# Patient Record
Sex: Male | Born: 1950
Health system: Southern US, Community
[De-identification: ages and names within clinical notes are randomized; demographics above are authoritative.]

## PROBLEM LIST (undated history)

## (undated) DIAGNOSIS — I2510 Atherosclerotic heart disease of native coronary artery without angina pectoris: Secondary | ICD-10-CM

## (undated) DIAGNOSIS — I509 Heart failure, unspecified: Secondary | ICD-10-CM

## (undated) DIAGNOSIS — C801 Malignant (primary) neoplasm, unspecified: Secondary | ICD-10-CM

## (undated) DIAGNOSIS — Z87442 Personal history of urinary calculi: Secondary | ICD-10-CM

## (undated) DIAGNOSIS — I10 Essential (primary) hypertension: Secondary | ICD-10-CM

## (undated) DIAGNOSIS — E119 Type 2 diabetes mellitus without complications: Secondary | ICD-10-CM

## (undated) DIAGNOSIS — M5430 Sciatica, unspecified side: Secondary | ICD-10-CM

## (undated) DIAGNOSIS — E785 Hyperlipidemia, unspecified: Secondary | ICD-10-CM

## (undated) DIAGNOSIS — N200 Calculus of kidney: Secondary | ICD-10-CM

## (undated) HISTORY — DX: Calculus of kidney: N20.0

## (undated) HISTORY — DX: Malignant (primary) neoplasm, unspecified: C80.1

## (undated) HISTORY — DX: Hyperlipidemia, unspecified: E78.5

## (undated) HISTORY — DX: Heart failure, unspecified: I50.9

## (undated) HISTORY — DX: Essential (primary) hypertension: I10

## (undated) HISTORY — PX: BACK SURGERY: SHX140

## (undated) HISTORY — PX: COLONOSCOPY: SHX174

## (undated) HISTORY — PX: LITHOTRIPSY: SUR834

## (undated) HISTORY — DX: Type 2 diabetes mellitus without complications: E11.9

## (undated) HISTORY — DX: Sciatica, unspecified side: M54.30

---

## 2016-03-15 DIAGNOSIS — R739 Hyperglycemia, unspecified: Secondary | ICD-10-CM | POA: Diagnosis not present

## 2016-03-15 DIAGNOSIS — Z720 Tobacco use: Secondary | ICD-10-CM | POA: Diagnosis not present

## 2016-03-15 DIAGNOSIS — N132 Hydronephrosis with renal and ureteral calculous obstruction: Secondary | ICD-10-CM | POA: Diagnosis not present

## 2016-03-15 DIAGNOSIS — E860 Dehydration: Secondary | ICD-10-CM | POA: Diagnosis not present

## 2016-03-15 DIAGNOSIS — N390 Urinary tract infection, site not specified: Secondary | ICD-10-CM | POA: Diagnosis not present

## 2016-03-15 DIAGNOSIS — R1032 Left lower quadrant pain: Secondary | ICD-10-CM | POA: Diagnosis not present

## 2016-03-16 DIAGNOSIS — N132 Hydronephrosis with renal and ureteral calculous obstruction: Secondary | ICD-10-CM | POA: Diagnosis not present

## 2016-03-16 DIAGNOSIS — N202 Calculus of kidney with calculus of ureter: Secondary | ICD-10-CM | POA: Diagnosis not present

## 2016-03-16 DIAGNOSIS — R739 Hyperglycemia, unspecified: Secondary | ICD-10-CM | POA: Diagnosis not present

## 2016-03-16 DIAGNOSIS — R1032 Left lower quadrant pain: Secondary | ICD-10-CM | POA: Diagnosis not present

## 2016-03-16 DIAGNOSIS — N201 Calculus of ureter: Secondary | ICD-10-CM | POA: Diagnosis not present

## 2016-03-16 DIAGNOSIS — Z452 Encounter for adjustment and management of vascular access device: Secondary | ICD-10-CM | POA: Diagnosis not present

## 2016-03-16 DIAGNOSIS — N390 Urinary tract infection, site not specified: Secondary | ICD-10-CM | POA: Diagnosis not present

## 2016-03-16 DIAGNOSIS — N23 Unspecified renal colic: Secondary | ICD-10-CM | POA: Diagnosis not present

## 2016-03-16 DIAGNOSIS — E860 Dehydration: Secondary | ICD-10-CM | POA: Diagnosis not present

## 2016-03-16 DIAGNOSIS — Z720 Tobacco use: Secondary | ICD-10-CM | POA: Diagnosis not present

## 2016-03-17 DIAGNOSIS — N23 Unspecified renal colic: Secondary | ICD-10-CM | POA: Diagnosis not present

## 2016-03-17 DIAGNOSIS — N202 Calculus of kidney with calculus of ureter: Secondary | ICD-10-CM | POA: Diagnosis not present

## 2016-03-17 DIAGNOSIS — N390 Urinary tract infection, site not specified: Secondary | ICD-10-CM | POA: Diagnosis not present

## 2016-03-21 DIAGNOSIS — N200 Calculus of kidney: Secondary | ICD-10-CM | POA: Diagnosis not present

## 2016-03-21 DIAGNOSIS — N201 Calculus of ureter: Secondary | ICD-10-CM | POA: Diagnosis not present

## 2016-03-21 DIAGNOSIS — N202 Calculus of kidney with calculus of ureter: Secondary | ICD-10-CM | POA: Diagnosis not present

## 2016-03-21 DIAGNOSIS — Z960 Presence of urogenital implants: Secondary | ICD-10-CM | POA: Diagnosis not present

## 2016-03-28 DIAGNOSIS — N201 Calculus of ureter: Secondary | ICD-10-CM | POA: Diagnosis not present

## 2016-03-28 DIAGNOSIS — Z836 Family history of other diseases of the respiratory system: Secondary | ICD-10-CM | POA: Diagnosis not present

## 2016-03-28 DIAGNOSIS — Z79899 Other long term (current) drug therapy: Secondary | ICD-10-CM | POA: Diagnosis not present

## 2016-03-28 DIAGNOSIS — R0602 Shortness of breath: Secondary | ICD-10-CM | POA: Diagnosis not present

## 2016-03-28 DIAGNOSIS — Z833 Family history of diabetes mellitus: Secondary | ICD-10-CM | POA: Diagnosis not present

## 2016-03-28 DIAGNOSIS — I729 Aneurysm of unspecified site: Secondary | ICD-10-CM | POA: Diagnosis not present

## 2016-03-28 DIAGNOSIS — Z87442 Personal history of urinary calculi: Secondary | ICD-10-CM | POA: Diagnosis not present

## 2016-03-28 DIAGNOSIS — Z01818 Encounter for other preprocedural examination: Secondary | ICD-10-CM | POA: Diagnosis not present

## 2016-03-28 DIAGNOSIS — N200 Calculus of kidney: Secondary | ICD-10-CM | POA: Diagnosis not present

## 2016-03-28 DIAGNOSIS — F17200 Nicotine dependence, unspecified, uncomplicated: Secondary | ICD-10-CM | POA: Diagnosis not present

## 2016-03-28 DIAGNOSIS — R079 Chest pain, unspecified: Secondary | ICD-10-CM | POA: Diagnosis not present

## 2016-03-28 DIAGNOSIS — Z538 Procedure and treatment not carried out for other reasons: Secondary | ICD-10-CM | POA: Diagnosis not present

## 2016-03-29 DIAGNOSIS — J449 Chronic obstructive pulmonary disease, unspecified: Secondary | ICD-10-CM | POA: Diagnosis not present

## 2016-03-29 DIAGNOSIS — Z79899 Other long term (current) drug therapy: Secondary | ICD-10-CM | POA: Diagnosis not present

## 2016-03-29 DIAGNOSIS — Z87442 Personal history of urinary calculi: Secondary | ICD-10-CM | POA: Diagnosis not present

## 2016-03-29 DIAGNOSIS — K219 Gastro-esophageal reflux disease without esophagitis: Secondary | ICD-10-CM | POA: Diagnosis not present

## 2016-03-29 DIAGNOSIS — F17200 Nicotine dependence, unspecified, uncomplicated: Secondary | ICD-10-CM | POA: Diagnosis not present

## 2016-03-29 DIAGNOSIS — I719 Aortic aneurysm of unspecified site, without rupture: Secondary | ICD-10-CM | POA: Diagnosis not present

## 2016-03-29 DIAGNOSIS — Z836 Family history of other diseases of the respiratory system: Secondary | ICD-10-CM | POA: Diagnosis not present

## 2016-03-29 DIAGNOSIS — N201 Calculus of ureter: Secondary | ICD-10-CM | POA: Diagnosis not present

## 2016-03-29 DIAGNOSIS — Z833 Family history of diabetes mellitus: Secondary | ICD-10-CM | POA: Diagnosis not present

## 2016-05-04 DIAGNOSIS — N202 Calculus of kidney with calculus of ureter: Secondary | ICD-10-CM | POA: Diagnosis not present

## 2016-08-18 DIAGNOSIS — R52 Pain, unspecified: Secondary | ICD-10-CM | POA: Diagnosis not present

## 2016-08-18 DIAGNOSIS — M25551 Pain in right hip: Secondary | ICD-10-CM | POA: Diagnosis not present

## 2016-08-18 DIAGNOSIS — Z87442 Personal history of urinary calculi: Secondary | ICD-10-CM | POA: Diagnosis not present

## 2016-08-18 DIAGNOSIS — X500XXA Overexertion from strenuous movement or load, initial encounter: Secondary | ICD-10-CM | POA: Diagnosis not present

## 2016-08-18 DIAGNOSIS — S339XXA Sprain of unspecified parts of lumbar spine and pelvis, initial encounter: Secondary | ICD-10-CM | POA: Diagnosis not present

## 2016-08-18 DIAGNOSIS — M5136 Other intervertebral disc degeneration, lumbar region: Secondary | ICD-10-CM | POA: Diagnosis not present

## 2016-08-18 DIAGNOSIS — F17200 Nicotine dependence, unspecified, uncomplicated: Secondary | ICD-10-CM | POA: Diagnosis not present

## 2016-08-18 DIAGNOSIS — S79911A Unspecified injury of right hip, initial encounter: Secondary | ICD-10-CM | POA: Diagnosis not present

## 2016-08-18 DIAGNOSIS — J449 Chronic obstructive pulmonary disease, unspecified: Secondary | ICD-10-CM | POA: Diagnosis not present

## 2016-08-18 DIAGNOSIS — R102 Pelvic and perineal pain: Secondary | ICD-10-CM | POA: Diagnosis not present

## 2016-08-18 DIAGNOSIS — M79604 Pain in right leg: Secondary | ICD-10-CM | POA: Diagnosis not present

## 2016-08-18 DIAGNOSIS — S73101A Unspecified sprain of right hip, initial encounter: Secondary | ICD-10-CM | POA: Diagnosis not present

## 2016-08-31 DIAGNOSIS — M79604 Pain in right leg: Secondary | ICD-10-CM | POA: Diagnosis not present

## 2016-09-06 DIAGNOSIS — Z6828 Body mass index (BMI) 28.0-28.9, adult: Secondary | ICD-10-CM | POA: Diagnosis not present

## 2016-09-06 DIAGNOSIS — M5126 Other intervertebral disc displacement, lumbar region: Secondary | ICD-10-CM | POA: Diagnosis not present

## 2016-09-06 DIAGNOSIS — R030 Elevated blood-pressure reading, without diagnosis of hypertension: Secondary | ICD-10-CM | POA: Diagnosis not present

## 2016-09-08 DIAGNOSIS — M5116 Intervertebral disc disorders with radiculopathy, lumbar region: Secondary | ICD-10-CM | POA: Diagnosis not present

## 2016-09-08 DIAGNOSIS — M5126 Other intervertebral disc displacement, lumbar region: Secondary | ICD-10-CM | POA: Diagnosis not present

## 2017-02-28 HISTORY — PX: NOSE SURGERY: SHX723

## 2017-08-02 DIAGNOSIS — R69 Illness, unspecified: Secondary | ICD-10-CM | POA: Diagnosis not present

## 2017-08-02 DIAGNOSIS — L989 Disorder of the skin and subcutaneous tissue, unspecified: Secondary | ICD-10-CM | POA: Diagnosis not present

## 2017-08-02 DIAGNOSIS — F17200 Nicotine dependence, unspecified, uncomplicated: Secondary | ICD-10-CM | POA: Diagnosis not present

## 2017-08-02 DIAGNOSIS — J069 Acute upper respiratory infection, unspecified: Secondary | ICD-10-CM | POA: Diagnosis not present

## 2017-08-22 DIAGNOSIS — J0141 Acute recurrent pansinusitis: Secondary | ICD-10-CM | POA: Diagnosis not present

## 2017-08-22 DIAGNOSIS — R634 Abnormal weight loss: Secondary | ICD-10-CM | POA: Diagnosis not present

## 2017-10-04 DIAGNOSIS — D485 Neoplasm of uncertain behavior of skin: Secondary | ICD-10-CM | POA: Diagnosis not present

## 2017-10-04 DIAGNOSIS — L570 Actinic keratosis: Secondary | ICD-10-CM | POA: Diagnosis not present

## 2017-10-04 DIAGNOSIS — D0472 Carcinoma in situ of skin of left lower limb, including hip: Secondary | ICD-10-CM | POA: Diagnosis not present

## 2017-10-04 DIAGNOSIS — C44321 Squamous cell carcinoma of skin of nose: Secondary | ICD-10-CM | POA: Diagnosis not present

## 2017-10-04 DIAGNOSIS — L821 Other seborrheic keratosis: Secondary | ICD-10-CM | POA: Diagnosis not present

## 2017-11-14 DIAGNOSIS — G8311 Monoplegia of lower limb affecting right dominant side: Secondary | ICD-10-CM | POA: Diagnosis not present

## 2017-11-14 DIAGNOSIS — G8929 Other chronic pain: Secondary | ICD-10-CM | POA: Diagnosis not present

## 2017-11-14 DIAGNOSIS — R69 Illness, unspecified: Secondary | ICD-10-CM | POA: Diagnosis not present

## 2017-11-14 DIAGNOSIS — J309 Allergic rhinitis, unspecified: Secondary | ICD-10-CM | POA: Diagnosis not present

## 2017-11-14 DIAGNOSIS — K5900 Constipation, unspecified: Secondary | ICD-10-CM | POA: Diagnosis not present

## 2017-11-14 DIAGNOSIS — J449 Chronic obstructive pulmonary disease, unspecified: Secondary | ICD-10-CM | POA: Diagnosis not present

## 2017-11-14 DIAGNOSIS — E669 Obesity, unspecified: Secondary | ICD-10-CM | POA: Diagnosis not present

## 2017-11-14 DIAGNOSIS — R269 Unspecified abnormalities of gait and mobility: Secondary | ICD-10-CM | POA: Diagnosis not present

## 2017-11-14 DIAGNOSIS — M479 Spondylosis, unspecified: Secondary | ICD-10-CM | POA: Diagnosis not present

## 2017-11-14 DIAGNOSIS — R030 Elevated blood-pressure reading, without diagnosis of hypertension: Secondary | ICD-10-CM | POA: Diagnosis not present

## 2018-02-28 DIAGNOSIS — C44311 Basal cell carcinoma of skin of nose: Secondary | ICD-10-CM | POA: Diagnosis not present

## 2018-02-28 DIAGNOSIS — D485 Neoplasm of uncertain behavior of skin: Secondary | ICD-10-CM | POA: Diagnosis not present

## 2018-03-05 DIAGNOSIS — S0120XA Unspecified open wound of nose, initial encounter: Secondary | ICD-10-CM | POA: Diagnosis not present

## 2018-03-05 DIAGNOSIS — R69 Illness, unspecified: Secondary | ICD-10-CM | POA: Diagnosis not present

## 2018-03-14 DIAGNOSIS — D485 Neoplasm of uncertain behavior of skin: Secondary | ICD-10-CM | POA: Diagnosis not present

## 2018-03-14 DIAGNOSIS — C44321 Squamous cell carcinoma of skin of nose: Secondary | ICD-10-CM | POA: Diagnosis not present

## 2018-04-04 DIAGNOSIS — C44321 Squamous cell carcinoma of skin of nose: Secondary | ICD-10-CM | POA: Diagnosis not present

## 2018-08-20 DIAGNOSIS — J069 Acute upper respiratory infection, unspecified: Secondary | ICD-10-CM | POA: Diagnosis not present

## 2018-08-20 DIAGNOSIS — R05 Cough: Secondary | ICD-10-CM | POA: Diagnosis not present

## 2018-08-20 DIAGNOSIS — H6592 Unspecified nonsuppurative otitis media, left ear: Secondary | ICD-10-CM | POA: Diagnosis not present

## 2019-05-26 DIAGNOSIS — R69 Illness, unspecified: Secondary | ICD-10-CM | POA: Diagnosis not present

## 2019-07-12 DIAGNOSIS — Z03818 Encounter for observation for suspected exposure to other biological agents ruled out: Secondary | ICD-10-CM | POA: Diagnosis not present

## 2019-07-12 DIAGNOSIS — Z1159 Encounter for screening for other viral diseases: Secondary | ICD-10-CM | POA: Diagnosis not present

## 2019-07-31 DIAGNOSIS — Z125 Encounter for screening for malignant neoplasm of prostate: Secondary | ICD-10-CM | POA: Diagnosis not present

## 2019-07-31 DIAGNOSIS — Z0000 Encounter for general adult medical examination without abnormal findings: Secondary | ICD-10-CM | POA: Diagnosis not present

## 2019-07-31 DIAGNOSIS — Z136 Encounter for screening for cardiovascular disorders: Secondary | ICD-10-CM | POA: Diagnosis not present

## 2019-08-12 DIAGNOSIS — Z1159 Encounter for screening for other viral diseases: Secondary | ICD-10-CM | POA: Diagnosis not present

## 2019-10-21 DIAGNOSIS — R252 Cramp and spasm: Secondary | ICD-10-CM | POA: Diagnosis not present

## 2019-10-22 DIAGNOSIS — Z1159 Encounter for screening for other viral diseases: Secondary | ICD-10-CM | POA: Diagnosis not present

## 2019-10-27 DIAGNOSIS — K635 Polyp of colon: Secondary | ICD-10-CM | POA: Diagnosis not present

## 2019-10-27 DIAGNOSIS — K5730 Diverticulosis of large intestine without perforation or abscess without bleeding: Secondary | ICD-10-CM | POA: Diagnosis not present

## 2019-10-27 DIAGNOSIS — Z1211 Encounter for screening for malignant neoplasm of colon: Secondary | ICD-10-CM | POA: Diagnosis not present

## 2019-10-30 DIAGNOSIS — K635 Polyp of colon: Secondary | ICD-10-CM | POA: Diagnosis not present

## 2019-11-26 ENCOUNTER — Other Ambulatory Visit: Payer: Self-pay | Admitting: *Deleted

## 2019-11-26 DIAGNOSIS — M25562 Pain in left knee: Secondary | ICD-10-CM

## 2019-11-26 DIAGNOSIS — M25561 Pain in right knee: Secondary | ICD-10-CM

## 2019-12-01 ENCOUNTER — Other Ambulatory Visit: Payer: Self-pay

## 2019-12-01 ENCOUNTER — Ambulatory Visit (INDEPENDENT_AMBULATORY_CARE_PROVIDER_SITE_OTHER): Payer: Medicare HMO | Admitting: Surgery

## 2019-12-01 ENCOUNTER — Encounter: Payer: Self-pay | Admitting: Surgery

## 2019-12-01 ENCOUNTER — Ambulatory Visit (HOSPITAL_COMMUNITY)
Admission: RE | Admit: 2019-12-01 | Discharge: 2019-12-01 | Disposition: A | Discharge status: Home / Self Care | Payer: Medicare HMO | Source: Ambulatory Visit | Attending: Surgery | Admitting: Surgery

## 2019-12-01 VITALS — BP 131/76 | HR 79 | Temp 97.3°F | Resp 20 | Ht 69.0 in | Wt 202.0 lb

## 2019-12-01 DIAGNOSIS — M25561 Pain in right knee: Secondary | ICD-10-CM | POA: Insufficient documentation

## 2019-12-01 DIAGNOSIS — M25562 Pain in left knee: Secondary | ICD-10-CM | POA: Insufficient documentation

## 2019-12-01 NOTE — Progress Notes (Signed)
Vascular and Vein Specialist of [place]  Patient name: Jonathan [name]. MRN: [id number] DOB: [birth year] Sex: male   REQUESTING PROVIDER:    Crissie Sickles   REASON FOR CONSULT:    Possible claudication  HISTORY OF PRESENT ILLNESS:   Jonathan [name]. is a 69 y.o. male, who is referred for evaluation of claudication.  The patient states that he gets leg pain while walking on his barn.  He will sometimes have trouble getting up because of pain in his knee.  He also endorses leg cramps at night.  The patient has a history of sciatica involving his right leg and has undergone surgical decompression.  He is a former smoker.  PAST MEDICAL HISTORY    Past Medical History:  Diagnosis Date  . Cancer ([place])    skin on nose  . Kidney stones   Sciatica   FAMILY HISTORY   Negative for premature cardiovascular disease  SOCIAL HISTORY:   Social History   Socioeconomic History  . Marital status: Married    Spouse name: Not on file  . Number of children: Not on file  . Years of education: Not on file  . Highest education level: Not on file  Occupational History  . Not on file  Tobacco Use  . Smoking status: Former Research scientist (life sciences)  . Smokeless tobacco: Never Used  Substance and Sexual Activity  . Alcohol use: Yes  . Drug use: Never  . Sexual activity: Not on file  Other Topics Concern  . Not on file  Social History Narrative  . Not on file   Social Determinants of Health   Financial Resource Strain:   . Difficulty of Paying Living Expenses:   Food Insecurity:   . Worried About Charity fundraiser in the Last Year:   . Arboriculturist in the Last Year:   Transportation Needs:   . Film/video editor (Medical):   Marland Kitchen Lack of Transportation (Non-Medical):   Physical Activity:   . Days of Exercise per Week:   . Minutes of Exercise per Session:   Stress:   . Feeling of Stress :   Social Connections:   . Frequency of Communication with  Friends and Family:   . Frequency of Social Gatherings with Friends and Family:   . Attends Religious Services:   . Active Member of Clubs or Organizations:   . Attends Archivist Meetings:   Marland Kitchen Marital Status:   Intimate Partner Violence:   . Fear of Current or Ex-Partner:   . Emotionally Abused:   Marland Kitchen Physically Abused:   . Sexually Abused:     ALLERGIES:    No Known Allergies  CURRENT MEDICATIONS:    None  REVIEW OF SYSTEMS:   [X]  denotes positive finding, [ ]  denotes negative finding Cardiac  Comments:  Chest pain or chest pressure:    Shortness of breath upon exertion: x   Short of breath when lying flat:    Irregular heart rhythm:        Vascular    Pain in calf, thigh, or hip brought on by ambulation: x   Pain in feet at night that wakes you up from your sleep:     Blood clot in your veins:    Leg swelling:         Pulmonary    Oxygen at home:    Productive cough:     Wheezing:         Neurologic  Sudden weakness in arms or legs:     Sudden numbness in arms or legs:     Sudden onset of difficulty speaking or slurred speech:    Temporary loss of vision in one eye:     Problems with dizziness:         Gastrointestinal    Blood in stool:      Vomited blood:         Genitourinary    Burning when urinating:     Blood in urine:        Psychiatric    Major depression:         Hematologic    Bleeding problems:    Problems with blood clotting too easily:        Skin    Rashes or ulcers:        Constitutional    Fever or chills:     PHYSICAL EXAM:   Vitals:   12/01/19 0832  BP: 131/76  Pulse: 79  Resp: 20  Temp: (!) 97.3 F (36.3 C)  SpO2: 93%  Weight: 202 lb (91.6 kg)  Height: 5\' 9"  (1.753 m)    GENERAL: The patient is a well-nourished male, in no acute distress. The vital signs are documented above. CARDIAC: There is a regular rate and rhythm.  VASCULAR: Palpable posterior tibial pulses bilaterally PULMONARY: Nonlabored  respirations MUSCULOSKELETAL: There are no major deformities or cyanosis. NEUROLOGIC: No focal weakness or paresthesias are detected. SKIN: There are no ulcers or rashes noted. PSYCHIATRIC: The patient has a normal affect.  STUDIES:   I have reviewed the following:  +-------+-----------+-----------+------------+------------+  ABI/TBIToday's ABIToday's TBIPrevious ABIPrevious TBI  +-------+-----------+-----------+------------+------------+  Right 1.15    0.83                  +-------+-----------+-----------+------------+------------+  Left  1.13    0.84                  +-------+-----------+-----------+------------+------------+  Right toe pressure:  127 Left toe pressure:  129  ASSESSMENT and PLAN   Bilateral leg pain: Vascular lab testing today indicates normal ankle-brachial indices and toe pressures with triphasic waveforms suggesting that he does not have atherosclerotic lower extremity vascular disease.  In addition he has palpable posterior tibial pulses bilaterally.  Therefore his leg pain is not vascular in origin.  It sounds like he is having either orthopedic issues with his right knee or possibly neurosurgical issues with his lower back.  No further vascular testing is indicated.  He will follow-up on an as-needed basis.   Leia Alf, MD, FACS [facility] [phone] Pager [phone]

## 2019-12-02 DIAGNOSIS — Z87891 Personal history of nicotine dependence: Secondary | ICD-10-CM | POA: Diagnosis not present

## 2019-12-02 DIAGNOSIS — I951 Orthostatic hypotension: Secondary | ICD-10-CM | POA: Diagnosis not present

## 2019-12-02 DIAGNOSIS — Z85828 Personal history of other malignant neoplasm of skin: Secondary | ICD-10-CM | POA: Diagnosis not present

## 2019-12-02 DIAGNOSIS — Z9181 History of falling: Secondary | ICD-10-CM | POA: Diagnosis not present

## 2019-12-02 DIAGNOSIS — R030 Elevated blood-pressure reading, without diagnosis of hypertension: Secondary | ICD-10-CM | POA: Diagnosis not present

## 2020-08-02 DIAGNOSIS — G8929 Other chronic pain: Secondary | ICD-10-CM | POA: Diagnosis not present

## 2020-08-02 DIAGNOSIS — R7309 Other abnormal glucose: Secondary | ICD-10-CM | POA: Diagnosis not present

## 2020-08-02 DIAGNOSIS — Z0000 Encounter for general adult medical examination without abnormal findings: Secondary | ICD-10-CM | POA: Diagnosis not present

## 2020-08-02 DIAGNOSIS — M25561 Pain in right knee: Secondary | ICD-10-CM | POA: Diagnosis not present

## 2020-08-02 DIAGNOSIS — L600 Ingrowing nail: Secondary | ICD-10-CM | POA: Diagnosis not present

## 2020-08-02 DIAGNOSIS — R252 Cramp and spasm: Secondary | ICD-10-CM | POA: Diagnosis not present

## 2020-08-02 DIAGNOSIS — M25562 Pain in left knee: Secondary | ICD-10-CM | POA: Diagnosis not present

## 2020-08-02 DIAGNOSIS — Z23 Encounter for immunization: Secondary | ICD-10-CM | POA: Diagnosis not present

## 2020-08-05 DIAGNOSIS — E1165 Type 2 diabetes mellitus with hyperglycemia: Secondary | ICD-10-CM | POA: Diagnosis not present

## 2020-08-06 DIAGNOSIS — S61002A Unspecified open wound of left thumb without damage to nail, initial encounter: Secondary | ICD-10-CM | POA: Diagnosis not present

## 2020-08-06 DIAGNOSIS — S61032A Puncture wound without foreign body of left thumb without damage to nail, initial encounter: Secondary | ICD-10-CM | POA: Diagnosis not present

## 2020-08-06 DIAGNOSIS — M79645 Pain in left finger(s): Secondary | ICD-10-CM | POA: Diagnosis not present

## 2020-08-06 DIAGNOSIS — S61042A Puncture wound with foreign body of left thumb without damage to nail, initial encounter: Secondary | ICD-10-CM | POA: Diagnosis not present

## 2020-08-10 DIAGNOSIS — M79645 Pain in left finger(s): Secondary | ICD-10-CM | POA: Diagnosis not present

## 2020-08-10 DIAGNOSIS — G8929 Other chronic pain: Secondary | ICD-10-CM | POA: Diagnosis not present

## 2020-08-19 DIAGNOSIS — M79645 Pain in left finger(s): Secondary | ICD-10-CM | POA: Diagnosis not present

## 2020-08-19 DIAGNOSIS — E1165 Type 2 diabetes mellitus with hyperglycemia: Secondary | ICD-10-CM | POA: Insufficient documentation

## 2020-08-19 DIAGNOSIS — N529 Male erectile dysfunction, unspecified: Secondary | ICD-10-CM | POA: Insufficient documentation

## 2020-08-19 DIAGNOSIS — F17200 Nicotine dependence, unspecified, uncomplicated: Secondary | ICD-10-CM | POA: Insufficient documentation

## 2020-08-19 DIAGNOSIS — F4329 Adjustment disorder with other symptoms: Secondary | ICD-10-CM | POA: Insufficient documentation

## 2020-08-19 DIAGNOSIS — G8929 Other chronic pain: Secondary | ICD-10-CM | POA: Diagnosis not present

## 2020-08-20 ENCOUNTER — Encounter: Payer: Self-pay | Admitting: Podiatry

## 2020-08-20 ENCOUNTER — Other Ambulatory Visit: Payer: Self-pay

## 2020-08-20 ENCOUNTER — Ambulatory Visit (INDEPENDENT_AMBULATORY_CARE_PROVIDER_SITE_OTHER): Payer: Medicare Other | Admitting: Podiatry

## 2020-08-20 DIAGNOSIS — L600 Ingrowing nail: Secondary | ICD-10-CM | POA: Diagnosis not present

## 2020-08-20 NOTE — Progress Notes (Signed)
Subjective:   Patient ID: Jonathan [name]., male   DOB: 70 y.o.   MRN: [id number]   HPI Patient presents with chronic ingrown toenail of his big toes bilateral and states has been trying to trim them himself soak them and it is no longer working and he wants them corrected.  Points to the medial border of the right hallux and the lateral border the left hallux states they've both been sore with no active drainage or redness noted.  Patient does not smoke and does like to be active if possible   Review of Systems  All other systems reviewed and are negative.       Objective:  Physical Exam Vitals and nursing note reviewed.  Constitutional:      Appearance: He is well-developed and well-nourished.  Cardiovascular:     Pulses: Intact distal pulses.  Pulmonary:     Effort: Pulmonary effort is normal.  Musculoskeletal:        General: Normal range of motion.  Skin:    General: Skin is warm.  Neurological:     Mental Status: He is alert.     Neurovascular status intact muscle strength adequate range of motion adequate.  Patient is found to have incurvated hallux nail borders bilateral medial right lateral left that are painful when pressed and are making shoe gear difficult.  Patient has good digit perfusion well oriented x3 with no other pathology     Assessment:  Ingrown toenail deformity hallux bilateral with pain     Plan:  H&P reviewed both conditions recommended correction.  I explained procedure risk patient wants surgery and signed consent form understanding risk.  Today I infiltrated each hallux 60 mg like Marcaine mixture sterile prep done and using sterile instrumentation remove the medial border right hallux lateral border left hallux exposed matrix and applied phenol 3 applications 30 seconds followed by alcohol lavage and sterile dressing.  Gave instructions on soaks and encouraged to leave dressing on 24 hours but take it off early if any throbbing were to occur and  patient is encouraged to call us with any questions concerns which may arise

## 2020-08-20 NOTE — Patient Instructions (Signed)
Place 1/4 cup of epsom salts in a quart of warm tap water.  Submerge your foot or feet in the solution and soak for 20 minutes.  This soak should be done twice a day.  Next, remove your foot or feet from solution, blot dry the affected area. Apply ointment and cover if instructed by your doctor.   IF YOUR SKIN BECOMES IRRITATED WHILE USING THESE INSTRUCTIONS, IT IS OKAY TO SWITCH TO  Dam VINEGAR AND WATER.  As another alternative soak, you may use antibacterial soap and water.  Monitor for any signs/symptoms of infection. Call the office immediately if any occur or go directly to the emergency room. Call with any questions/concerns.  

## 2020-09-13 DIAGNOSIS — M25561 Pain in right knee: Secondary | ICD-10-CM | POA: Diagnosis not present

## 2020-09-13 DIAGNOSIS — M25562 Pain in left knee: Secondary | ICD-10-CM | POA: Diagnosis not present

## 2020-10-01 DIAGNOSIS — M1711 Unilateral primary osteoarthritis, right knee: Secondary | ICD-10-CM | POA: Diagnosis not present

## 2020-10-08 DIAGNOSIS — M25561 Pain in right knee: Secondary | ICD-10-CM | POA: Insufficient documentation

## 2020-12-16 DIAGNOSIS — E119 Type 2 diabetes mellitus without complications: Secondary | ICD-10-CM | POA: Diagnosis not present

## 2020-12-16 DIAGNOSIS — R0602 Shortness of breath: Secondary | ICD-10-CM | POA: Diagnosis not present

## 2020-12-16 DIAGNOSIS — Z720 Tobacco use: Secondary | ICD-10-CM | POA: Diagnosis not present

## 2020-12-19 NOTE — Progress Notes (Signed)
Cardiology Office Note   Date:  12/22/2020   ID:  Jonathan Route., DOB February 22, 1951, MRN [id number]  PCP:  Carolee Rota, NP  Cardiologist:   Rylei Masella Martinique, MD   Chief Complaint  Patient presents with  . Congestive Heart Failure      History of Present Illness: Jonathan [name]. is a 70 y.o. male who is seen at the request of Carolee Rota NP for evaluation of CHF. He has a history of DM and HLD. He was recently admitted to [facility] 12/20/20 with acute  CHF. Pro BNP was 909. HS troponin 50>46>46. CXR and CT chest showed mild interstitial edema. No PE. Echo showed moderate to severe LV enlargement. EF 25-30%. Moderate MR and LAE. Normal RV function. Gr 1 diastolic dysfunction. He was diuresed 7 lbs. Seen today to establish cardiology care. He was discharged on lisinopril 2.5 mg daily only.   He reports he has not felt well for 2 months. Noted increasing SOB and feeling exhausted. He awoke suddenly Monday morning and couldn't breath and went to the ED. He does note some sharp pain in the chest when his heart beats. Denies any edema, palpitations, dizziness. He was just diagnosed with DM and HTN this year. He is a former smoker.     Past Medical History:  Diagnosis Date  . Cancer ([place])    skin on nose  . CHF (congestive heart failure) ([place])   . Diabetes mellitus without complication ([place])   . Hyperlipidemia   . Hypertension   . Kidney stones   . Sciatica     Past Surgical History:  Procedure Laterality Date  . BACK SURGERY    . LITHOTRIPSY       Current Outpatient Medications  Medication Sig Dispense Refill  . albuterol (VENTOLIN HFA) 108 (90 Base) MCG/ACT inhaler 2 puffs.    . Cholecalciferol (VITAMIN D) 50 MCG (2000 UT) CAPS 1 capsule    . dapagliflozin propanediol (FARXIGA) 10 MG TABS tablet Take 1 tablet (10 mg total) by mouth daily before breakfast. 30 tablet 11  . fluticasone (FLONASE) 50 MCG/ACT nasal spray 1 spray into each nostril.    . furosemide  (LASIX) 20 MG tablet Take 1 tablet (20 mg total) by mouth daily. 90 tablet 3  . Magnesium 300 MG CAPS 1 capsule with a meal    . metFORMIN (GLUCOPHAGE) 500 MG tablet Take 500 mg by mouth 2 (two) times daily.    . sacubitril-valsartan (ENTRESTO) 24-26 MG Take 1 tablet by mouth 2 (two) times daily. 60 tablet 11  . vitamin E 45 MG (100 UNITS) capsule 1 capsule     No current facility-administered medications for this visit.    Allergies:   Patient has no known allergies.    Social History:  The patient  reports that he quit smoking about 3 years ago. He has never used smokeless tobacco. He reports current alcohol use. He reports that he does not use drugs.   Family History:  The patient's family history includes COPD in his mother; Heart attack in his father; Heart failure in his mother.    ROS:  Please see the history of present illness.   Otherwise, review of systems are positive for none.   All other systems are reviewed and negative.    PHYSICAL EXAM: VS:  BP 112/70 (BP Location: Left Arm, Patient Position: Sitting, Cuff Size: Normal)   Pulse 95   Ht 5\' 9"  (1.753 m)   Wt  203 lb 3.2 oz (92.2 kg)   BMI 30.01 kg/m  , BMI Body mass index is 30.01 kg/m. GEN: Well nourished, well developed, in no acute distress  HEENT: normal  Neck: no JVD, carotid bruits, or masses Cardiac: RRR; no murmurs, rubs, or gallops,no edema  Respiratory:  clear to auscultation bilaterally, normal work of breathing GI: soft, nontender, nondistended, + BS MS: no deformity or atrophy  Skin: warm and dry, no rash Neuro:  Strength and sensation are intact Psych: euthymic mood, full affect   EKG:  EKG is ordered today. The ekg ordered today demonstrates NSR with rate 95 bpm. ST - T wave abnormality c/w inferolateral ischemia. I have personally reviewed and interpreted this study.    Recent Labs: No results found for requested labs within last 8760 hours.    Lipid Panel No results found for: CHOL,  TRIG, HDL, CHOLHDL, VLDL, LDLCALC, LDLDIRECT   Dated 07/31/19 cholesterol 190, triglycerides 99, HDL 35, LDL 137.Hgb 14.5. Dated 12/16/20: A1c 9.1%. CMET normal Dated 12/20/20: D dimer 0.61. Coags normal. Normal swab for COVID and flu UA glucose > 1000. 15 mg/dl ketones.  CBC normal Glucose 307. Creatinine 0.97>>1.14. otherwise CMET normal.  Wt Readings from Last 3 Encounters:  12/22/20 203 lb 3.2 oz (92.2 kg)  12/01/19 202 lb (91.6 kg)      Other studies Reviewed: Additional studies/ records that were reviewed today include:  CT chest 12/20/20: Impression Performed by [facility] RAD 1. Negative for acute pulmonary embolus.  2. Basilar predominant pulmonary edema with small layering pleural  effusions. Mild superimposed Emphysema (ICD10-J43.9).  3. Calcified coronary artery and Aortic Atherosclerosis  (ICD10-I70.0).    Echo 12/21/20:Summary  1. Technically difficult study.  2. The left ventricle is moderately to severely dilated in size with normal  wall thickness.  3. The left ventricular systolic function is severely decreased, LVEF is  visually estimated at 25-30%.  4. There is grade I diastolic dysfunction (impaired relaxation).  5. The left atrium is moderately dilated in size.  6. Degenerative appearing mitral valve with at least moderate regurgitation  (eccentric jet).  7. The right ventricle is normal in size, with normal systolic function.       ASSESSMENT AND PLAN:  1.  Acute systolic CHF. EF 25-30%. Just DC from the hospital post diuresis. Multiple cardiac risk factors including DM, HTN, HLD and former smoker. Recommend [facility]. Will arrange next week. The procedure and risks were reviewed including but not limited to death, myocardial infarction, stroke, arrythmias, bleeding, transfusion, emergency surgery, dye allergy, or renal dysfunction. The patient voices understanding and is agreeable to proceed.  Also recommend stopping lisinopril. Will start Entresto  on Monday after being off ACEi for 3 days. 24/26 mg bid. Will also start Farxiga 10 mg daily. Will add lasix 20 mg daily. Plan to gradually optimize CHF therapy with beta blocker and aldactone as tolerated. Repeat Echo 3-4 months  2. DM poorly controlled. On metformin. Will add Farxiga 10 mg daily. Needs close follow up with primary care.  3. HLD. Will update lipid panel. Anticipate he will need a statin depending on results of cath  4. HTN. Will monitor with above medications.  5. Former smoker.    Current medicines are reviewed at length with the patient today.  The patient does not have concerns regarding medicines.  The following changes have been made:  See above  Labs/ tests ordered today include:   Orders Placed This Encounter  Procedures  . EKG 12-Lead    [facility].    Disposition:   FU post cardiac cath.   Signed, Conlin Brahm, MD  12/22/2020 3:04 PM    [facility] [address], , [place], [address] Phone [phone], Fax [phone]   

## 2020-12-19 NOTE — H&P (View-Only) (Signed)
Cardiology Office Note   Date:  12/22/2020   ID:  Jonathan Route., DOB February 22, 1951, MRN [id number]  PCP:  Jonathan Rota, NP  Cardiologist:   Jonathan Halls Martinique, MD   Chief Complaint  Patient presents with  . Congestive Heart Failure      History of Present Illness: Jonathan [name]. is a 70 y.o. male who is seen at the request of Jonathan Rota NP for evaluation of CHF. He has a history of DM and HLD. He was recently admitted to [facility] 12/20/20 with acute  CHF. Pro BNP was 909. HS troponin 50>46>46. CXR and CT chest showed mild interstitial edema. No PE. Echo showed moderate to severe LV enlargement. EF 25-30%. Moderate MR and LAE. Normal RV function. Gr 1 diastolic dysfunction. He was diuresed 7 lbs. Seen today to establish cardiology care. He was discharged on lisinopril 2.5 mg daily only.   He reports he has not felt well for 2 months. Noted increasing SOB and feeling exhausted. He awoke suddenly Monday morning and couldn't breath and went to the ED. He does note some sharp pain in the chest when his heart beats. Denies any edema, palpitations, dizziness. He was just diagnosed with DM and HTN this year. He is a former smoker.     Past Medical History:  Diagnosis Date  . Cancer ([place])    skin on nose  . CHF (congestive heart failure) ([place])   . Diabetes mellitus without complication ([place])   . Hyperlipidemia   . Hypertension   . Kidney stones   . Sciatica     Past Surgical History:  Procedure Laterality Date  . BACK SURGERY    . LITHOTRIPSY       Current Outpatient Medications  Medication Sig Dispense Refill  . albuterol (VENTOLIN HFA) 108 (90 Base) MCG/ACT inhaler 2 puffs.    . Cholecalciferol (VITAMIN D) 50 MCG (2000 UT) CAPS 1 capsule    . dapagliflozin propanediol (FARXIGA) 10 MG TABS tablet Take 1 tablet (10 mg total) by mouth daily before breakfast. 30 tablet 11  . fluticasone (FLONASE) 50 MCG/ACT nasal spray 1 spray into each nostril.    . furosemide  (LASIX) 20 MG tablet Take 1 tablet (20 mg total) by mouth daily. 90 tablet 3  . Magnesium 300 MG CAPS 1 capsule with a meal    . metFORMIN (GLUCOPHAGE) 500 MG tablet Take 500 mg by mouth 2 (two) times daily.    . sacubitril-valsartan (ENTRESTO) 24-26 MG Take 1 tablet by mouth 2 (two) times daily. 60 tablet 11  . vitamin E 45 MG (100 UNITS) capsule 1 capsule     No current facility-administered medications for this visit.    Allergies:   Patient has no known allergies.    Social History:  The patient  reports that he quit smoking about 3 years ago. He has never used smokeless tobacco. He reports current alcohol use. He reports that he does not use drugs.   Family History:  The patient's family history includes COPD in his mother; Heart attack in his father; Heart failure in his mother.    ROS:  Please see the history of present illness.   Otherwise, review of systems are positive for none.   All other systems are reviewed and negative.    PHYSICAL EXAM: VS:  BP 112/70 (BP Location: Left Arm, Patient Position: Sitting, Cuff Size: Normal)   Pulse 95   Ht 5\' 9"  (1.753 m)   Wt  203 lb 3.2 oz (92.2 kg)   BMI 30.01 kg/m  , BMI Body mass index is 30.01 kg/m. GEN: Well nourished, well developed, in no acute distress  HEENT: normal  Neck: no JVD, carotid bruits, or masses Cardiac: RRR; no murmurs, rubs, or gallops,no edema  Respiratory:  clear to auscultation bilaterally, normal work of breathing GI: soft, nontender, nondistended, + BS MS: no deformity or atrophy  Skin: warm and dry, no rash Neuro:  Strength and sensation are intact Psych: euthymic mood, full affect   EKG:  EKG is ordered today. The ekg ordered today demonstrates NSR with rate 95 bpm. ST - T wave abnormality c/w inferolateral ischemia. I have personally reviewed and interpreted this study.    Recent Labs: No results found for requested labs within last 8760 hours.    Lipid Panel No results found for: CHOL,  TRIG, HDL, CHOLHDL, VLDL, LDLCALC, LDLDIRECT   Dated 07/31/19 cholesterol 190, triglycerides 99, HDL 35, LDL 137.Hgb 14.5. Dated 12/16/20: A1c 9.1%. CMET normal Dated 12/20/20: D dimer 0.61. Coags normal. Normal swab for COVID and flu UA glucose > 1000. 15 mg/dl ketones.  CBC normal Glucose 307. Creatinine 0.97>>1.14. otherwise CMET normal.  Wt Readings from Last 3 Encounters:  12/22/20 203 lb 3.2 oz (92.2 kg)  12/01/19 202 lb (91.6 kg)      Other studies Reviewed: Additional studies/ records that were reviewed today include:  CT chest 12/20/20: Impression Performed by [facility] RAD 1. Negative for acute pulmonary embolus.  2. Basilar predominant pulmonary edema with small layering pleural  effusions. Mild superimposed Emphysema (ICD10-J43.9).  3. Calcified coronary artery and Aortic Atherosclerosis  (ICD10-I70.0).    Echo 12/21/20:Summary  1. Technically difficult study.  2. The left ventricle is moderately to severely dilated in size with normal  wall thickness.  3. The left ventricular systolic function is severely decreased, LVEF is  visually estimated at 25-30%.  4. There is grade I diastolic dysfunction (impaired relaxation).  5. The left atrium is moderately dilated in size.  6. Degenerative appearing mitral valve with at least moderate regurgitation  (eccentric jet).  7. The right ventricle is normal in size, with normal systolic function.       ASSESSMENT AND PLAN:  1.  Acute systolic CHF. EF 25-30%. Just DC from the hospital post diuresis. Multiple cardiac risk factors including DM, HTN, HLD and former smoker. Recommend [place]. Will arrange next week. The procedure and risks were reviewed including but not limited to death, myocardial infarction, stroke, arrythmias, bleeding, transfusion, emergency surgery, dye allergy, or renal dysfunction. The patient voices understanding and is agreeable to proceed.  Also recommend stopping lisinopril. Will start Entresto  on Monday after being off ACEi for 3 days. 24/26 mg bid. Will also start Farxiga 10 mg daily. Will add lasix 20 mg daily. Plan to gradually optimize CHF therapy with beta blocker and aldactone as tolerated. Repeat Echo 3-4 months  2. DM poorly controlled. On metformin. Will add Farxiga 10 mg daily. Needs close follow up with primary care.  3. HLD. Will update lipid panel. Anticipate he will need a statin depending on results of cath  4. HTN. Will monitor with above medications.  5. Former smoker.    Current medicines are reviewed at length with the patient today.  The patient does not have concerns regarding medicines.  The following changes have been made:  See above  Labs/ tests ordered today include:   Orders Placed This Encounter  Procedures  . EKG 12-Lead  [place].    Disposition:   FU post cardiac cath.   Signed, Lavender Stanke Martinique, MD  12/22/2020 3:04 PM    [facility] [address] [place], [place], [address] Phone [phone], Fax [phone]

## 2020-12-20 DIAGNOSIS — J90 Pleural effusion, not elsewhere classified: Secondary | ICD-10-CM | POA: Diagnosis not present

## 2020-12-20 DIAGNOSIS — E1165 Type 2 diabetes mellitus with hyperglycemia: Secondary | ICD-10-CM | POA: Diagnosis not present

## 2020-12-20 DIAGNOSIS — I340 Nonrheumatic mitral (valve) insufficiency: Secondary | ICD-10-CM | POA: Diagnosis not present

## 2020-12-20 DIAGNOSIS — I509 Heart failure, unspecified: Secondary | ICD-10-CM | POA: Diagnosis not present

## 2020-12-20 DIAGNOSIS — Z7984 Long term (current) use of oral hypoglycemic drugs: Secondary | ICD-10-CM | POA: Diagnosis not present

## 2020-12-20 DIAGNOSIS — J439 Emphysema, unspecified: Secondary | ICD-10-CM | POA: Diagnosis not present

## 2020-12-20 DIAGNOSIS — I700 Atherosclerosis of aorta: Secondary | ICD-10-CM | POA: Diagnosis not present

## 2020-12-20 DIAGNOSIS — G894 Chronic pain syndrome: Secondary | ICD-10-CM | POA: Diagnosis not present

## 2020-12-20 DIAGNOSIS — F17200 Nicotine dependence, unspecified, uncomplicated: Secondary | ICD-10-CM | POA: Diagnosis not present

## 2020-12-20 DIAGNOSIS — I5031 Acute diastolic (congestive) heart failure: Secondary | ICD-10-CM | POA: Insufficient documentation

## 2020-12-20 DIAGNOSIS — G8929 Other chronic pain: Secondary | ICD-10-CM | POA: Diagnosis not present

## 2020-12-20 DIAGNOSIS — Z20822 Contact with and (suspected) exposure to covid-19: Secondary | ICD-10-CM | POA: Diagnosis not present

## 2020-12-20 DIAGNOSIS — R9431 Abnormal electrocardiogram [ECG] [EKG]: Secondary | ICD-10-CM | POA: Diagnosis not present

## 2020-12-20 DIAGNOSIS — E119 Type 2 diabetes mellitus without complications: Secondary | ICD-10-CM | POA: Diagnosis not present

## 2020-12-20 DIAGNOSIS — Z87891 Personal history of nicotine dependence: Secondary | ICD-10-CM | POA: Diagnosis not present

## 2020-12-20 DIAGNOSIS — J811 Chronic pulmonary edema: Secondary | ICD-10-CM | POA: Diagnosis not present

## 2020-12-20 DIAGNOSIS — I110 Hypertensive heart disease with heart failure: Secondary | ICD-10-CM | POA: Diagnosis not present

## 2020-12-20 DIAGNOSIS — R079 Chest pain, unspecified: Secondary | ICD-10-CM | POA: Diagnosis not present

## 2020-12-20 DIAGNOSIS — R0600 Dyspnea, unspecified: Secondary | ICD-10-CM | POA: Insufficient documentation

## 2020-12-20 DIAGNOSIS — I2510 Atherosclerotic heart disease of native coronary artery without angina pectoris: Secondary | ICD-10-CM | POA: Diagnosis not present

## 2020-12-20 DIAGNOSIS — R0602 Shortness of breath: Secondary | ICD-10-CM | POA: Diagnosis not present

## 2020-12-21 DIAGNOSIS — I517 Cardiomegaly: Secondary | ICD-10-CM | POA: Diagnosis not present

## 2020-12-21 DIAGNOSIS — G8929 Other chronic pain: Secondary | ICD-10-CM | POA: Diagnosis not present

## 2020-12-21 DIAGNOSIS — I340 Nonrheumatic mitral (valve) insufficiency: Secondary | ICD-10-CM | POA: Diagnosis not present

## 2020-12-21 DIAGNOSIS — R0600 Dyspnea, unspecified: Secondary | ICD-10-CM | POA: Diagnosis not present

## 2020-12-21 DIAGNOSIS — I5189 Other ill-defined heart diseases: Secondary | ICD-10-CM | POA: Diagnosis not present

## 2020-12-21 DIAGNOSIS — Z79899 Other long term (current) drug therapy: Secondary | ICD-10-CM | POA: Diagnosis not present

## 2020-12-21 DIAGNOSIS — Z79811 Long term (current) use of aromatase inhibitors: Secondary | ICD-10-CM | POA: Diagnosis not present

## 2020-12-21 DIAGNOSIS — I5031 Acute diastolic (congestive) heart failure: Secondary | ICD-10-CM | POA: Diagnosis not present

## 2020-12-21 DIAGNOSIS — E1165 Type 2 diabetes mellitus with hyperglycemia: Secondary | ICD-10-CM | POA: Diagnosis not present

## 2020-12-21 DIAGNOSIS — F17200 Nicotine dependence, unspecified, uncomplicated: Secondary | ICD-10-CM | POA: Diagnosis not present

## 2020-12-22 ENCOUNTER — Encounter: Payer: Self-pay | Admitting: Cardiology

## 2020-12-22 ENCOUNTER — Other Ambulatory Visit: Payer: Self-pay | Admitting: Cardiology

## 2020-12-22 ENCOUNTER — Ambulatory Visit (INDEPENDENT_AMBULATORY_CARE_PROVIDER_SITE_OTHER): Payer: Medicare Other | Admitting: Cardiology

## 2020-12-22 ENCOUNTER — Other Ambulatory Visit: Payer: Self-pay

## 2020-12-22 VITALS — BP 112/70 | HR 95 | Ht 69.0 in | Wt 203.2 lb

## 2020-12-22 DIAGNOSIS — I5021 Acute systolic (congestive) heart failure: Secondary | ICD-10-CM | POA: Diagnosis not present

## 2020-12-22 DIAGNOSIS — I10 Essential (primary) hypertension: Secondary | ICD-10-CM | POA: Diagnosis not present

## 2020-12-22 DIAGNOSIS — E7800 Pure hypercholesterolemia, unspecified: Secondary | ICD-10-CM | POA: Diagnosis not present

## 2020-12-22 DIAGNOSIS — E119 Type 2 diabetes mellitus without complications: Secondary | ICD-10-CM | POA: Diagnosis not present

## 2020-12-22 MED ORDER — SODIUM CHLORIDE 0.9% FLUSH: 3.0000 mL | Freq: Two times a day (BID) | INTRAVENOUS | Status: DC

## 2020-12-22 MED ORDER — FUROSEMIDE 20 MG PO TABS: 20.0000 mg | ORAL_TABLET | Freq: Every day | ORAL | 3 refills | Status: DC

## 2020-12-22 MED ORDER — ENTRESTO 24-26 MG PO TABS: 1.0000 | ORAL_TABLET | Freq: Two times a day (BID) | ORAL | 11 refills | Status: DC

## 2020-12-22 MED ORDER — DAPAGLIFLOZIN PROPANEDIOL 10 MG PO TABS: 10.0000 mg | ORAL_TABLET | Freq: Every day | ORAL | 11 refills | Status: DC

## 2020-12-22 NOTE — Addendum Note (Signed)
Addended by: Kathyrn Lass on: 12/22/2020 03:17 PM   Modules accepted: Orders

## 2020-12-22 NOTE — Patient Instructions (Addendum)
Stop taking fish oil and lisinopril  Stop Spiriva  On Monday start Entresto 24/26 mg twice a day  Start Farxiga 10 mg daily.  Start lasix 20 mg daily  We will arrange a right and left heart catheterization next week.     Schedule appointment with Pharmacist for [facility] follow up    Appointment scheduled with Dr.Taevon Aschoff  Monday 01/17/21 at 2:00 pm        [place] [place] [address] Dept: [phone] Loc: [phone]     You are scheduled for a Cardiac cath Thursday 12/30/20 on with Dr.Kae Lauman.  1. Please arrive at the [facility] (Main Entrance A) at [facility]: [address] [place],  [address] at 5:30 am (This time is two hours before your procedure to ensure your preparation). Free valet parking service is available.   Special note: Every effort is made to have your procedure done on time. Please understand that emergencies sometimes delay scheduled procedures.  2. Diet: Do not eat solid foods after midnight.  The patient may have clear liquids until 5am upon the day of the procedure.  3. Labs: You will need to have blood drawn on Wed 12/22/20. You do not need to be fasting.  4. Medication instructions in preparation for your procedure:    Hold Metformin morning of cath and hold 2 days after cath.  Hold Furosemide morning of cath.    On the morning of your procedure, take Aspirin 81 mg. and any morning medicines NOT listed above.  You may use sips of water.  5. Plan for one night stay--bring personal belongings. 6. Bring a current list of your medications and current insurance cards. 7. You MUST have a responsible person to drive you home. 8. Someone MUST be with you the first 24 hours after you arrive home or your discharge will be delayed. 9. Please wear clothes that are easy to get on and off and wear slip-on shoes.  Thank you for  allowing [place] to care for you!   --  Invasive Cardiovascular services

## 2020-12-23 LAB — CBC WITH DIFFERENTIAL/PLATELET
Basophils Absolute: 0.1 10*3/uL (ref 0.0–0.2)
Basos: 1 %
EOS (ABSOLUTE): 0.1 10*3/uL (ref 0.0–0.4)
Eos: 2 %
Hematocrit: 47.5 % (ref 37.5–51.0)
Hemoglobin: 16.1 g/dL (ref 13.0–17.7)
Immature Grans (Abs): 0 10*3/uL (ref 0.0–0.1)
Immature Granulocytes: 0 %
Lymphocytes Absolute: 3.3 10*3/uL — ABNORMAL HIGH (ref 0.7–3.1)
Lymphs: 42 %
MCH: 31.4 pg (ref 26.6–33.0)
MCHC: 33.9 g/dL (ref 31.5–35.7)
MCV: 93 fL (ref 79–97)
Monocytes Absolute: 0.7 10*3/uL (ref 0.1–0.9)
Monocytes: 10 %
Neutrophils Absolute: 3.5 10*3/uL (ref 1.4–7.0)
Neutrophils: 45 %
Platelets: 172 10*3/uL (ref 150–450)
RBC: 5.12 x10E6/uL (ref 4.14–5.80)
RDW: 13 % (ref 11.6–15.4)
WBC: 7.7 10*3/uL (ref 3.4–10.8)

## 2020-12-23 LAB — BASIC METABOLIC PANEL
BUN/Creatinine Ratio: 31 — ABNORMAL HIGH (ref 10–24)
BUN: 27 mg/dL (ref 8–27)
CO2: 21 mmol/L (ref 20–29)
Calcium: 9.9 mg/dL (ref 8.6–10.2)
Chloride: 101 mmol/L (ref 96–106)
Creatinine, Ser: 0.87 mg/dL (ref 0.76–1.27)
Glucose: 185 mg/dL — ABNORMAL HIGH (ref 65–99)
Potassium: 4.9 mmol/L (ref 3.5–5.2)
Sodium: 139 mmol/L (ref 134–144)
eGFR: 93 mL/min/{1.73_m2} (ref >59)

## 2020-12-23 LAB — LIPID PANEL
Chol/HDL Ratio: 5.7 ratio — ABNORMAL HIGH (ref 0.0–5.0)
Cholesterol, Total: 211 mg/dL — ABNORMAL HIGH (ref 100–199)
HDL: 37 mg/dL — ABNORMAL LOW (ref >39)
LDL Chol Calc (NIH): 133 mg/dL — ABNORMAL HIGH (ref 0–99)
Triglycerides: 226 mg/dL — ABNORMAL HIGH (ref 0–149)
VLDL Cholesterol Cal: 41 mg/dL — ABNORMAL HIGH (ref 5–40)

## 2020-12-23 LAB — PT AND PTT
INR: 1 (ref 0.9–1.2)
Prothrombin Time: 10.6 s (ref 9.1–12.0)
aPTT: 26 s (ref 24–33)

## 2020-12-24 ENCOUNTER — Other Ambulatory Visit: Payer: Self-pay

## 2020-12-24 MED ORDER — ROSUVASTATIN CALCIUM 20 MG PO TABS: 20.0000 mg | ORAL_TABLET | Freq: Every day | ORAL | 3 refills | Status: DC

## 2020-12-28 DIAGNOSIS — Z7984 Long term (current) use of oral hypoglycemic drugs: Secondary | ICD-10-CM | POA: Diagnosis not present

## 2020-12-28 DIAGNOSIS — E1169 Type 2 diabetes mellitus with other specified complication: Secondary | ICD-10-CM | POA: Diagnosis not present

## 2020-12-28 DIAGNOSIS — I10 Essential (primary) hypertension: Secondary | ICD-10-CM | POA: Diagnosis not present

## 2020-12-28 DIAGNOSIS — I509 Heart failure, unspecified: Secondary | ICD-10-CM | POA: Diagnosis not present

## 2020-12-28 DIAGNOSIS — E1165 Type 2 diabetes mellitus with hyperglycemia: Secondary | ICD-10-CM | POA: Diagnosis not present

## 2020-12-29 ENCOUNTER — Telehealth: Payer: Self-pay

## 2020-12-29 NOTE — Telephone Encounter (Signed)
Left another message for the pt to call back.  

## 2020-12-29 NOTE — Telephone Encounter (Addendum)
Left message for the pt to call back:   Pt contacted pre-catheterization scheduled at [facility] for: 12/30/20 Verified arrival time and place: [facility]) at: 5:30 am    No solid food after midnight prior to cath, clear liquids until 5 AM day of procedure.  CONTRAST ALLERGY:  AM meds can be  taken pre-cath with sips of water including: ASA 81 mg  Hold Metformin, Lasix and Farxiga day of the procedure.   Confirmed patient has responsible adult to drive home post procedure and be with patient first 24 hours after arriving home:  You are allowed ONE visitor in the waiting room during the time you are at the hospital for your procedure. Both you and your visitor must wear a mask once you enter the hospital.   Patient reports does not currently have any symptoms concerning for COVID-19 and no household members with COVID-19 like illness.

## 2020-12-30 ENCOUNTER — Other Ambulatory Visit: Payer: Self-pay

## 2020-12-30 ENCOUNTER — Encounter (HOSPITAL_COMMUNITY)
Admission: RE | Disposition: A | Discharge status: Home / Self Care | Payer: Self-pay | Source: Home / Self Care | Attending: Cardiology

## 2020-12-30 ENCOUNTER — Ambulatory Visit (HOSPITAL_COMMUNITY)
Admission: RE | Admit: 2020-12-30 | Discharge: 2020-12-30 | Disposition: A | Discharge status: Home / Self Care | Payer: Medicare Other | Attending: Cardiology | Admitting: Cardiology

## 2020-12-30 DIAGNOSIS — E1165 Type 2 diabetes mellitus with hyperglycemia: Secondary | ICD-10-CM | POA: Diagnosis present

## 2020-12-30 DIAGNOSIS — Z87891 Personal history of nicotine dependence: Secondary | ICD-10-CM | POA: Insufficient documentation

## 2020-12-30 DIAGNOSIS — Z79899 Other long term (current) drug therapy: Secondary | ICD-10-CM | POA: Insufficient documentation

## 2020-12-30 DIAGNOSIS — I2510 Atherosclerotic heart disease of native coronary artery without angina pectoris: Secondary | ICD-10-CM

## 2020-12-30 DIAGNOSIS — I110 Hypertensive heart disease with heart failure: Secondary | ICD-10-CM | POA: Insufficient documentation

## 2020-12-30 DIAGNOSIS — Z7984 Long term (current) use of oral hypoglycemic drugs: Secondary | ICD-10-CM | POA: Diagnosis not present

## 2020-12-30 DIAGNOSIS — E785 Hyperlipidemia, unspecified: Secondary | ICD-10-CM | POA: Insufficient documentation

## 2020-12-30 DIAGNOSIS — I10 Essential (primary) hypertension: Secondary | ICD-10-CM | POA: Diagnosis present

## 2020-12-30 DIAGNOSIS — I5021 Acute systolic (congestive) heart failure: Secondary | ICD-10-CM | POA: Insufficient documentation

## 2020-12-30 HISTORY — PX: RIGHT/LEFT HEART CATH AND CORONARY ANGIOGRAPHY: CATH118266

## 2020-12-30 LAB — POCT I-STAT 7, (LYTES, BLD GAS, ICA,H+H)
Acid-base deficit: 6 mmol/L — ABNORMAL HIGH (ref 0.0–2.0)
Bicarbonate: 19.9 mmol/L — ABNORMAL LOW (ref 20.0–28.0)
Calcium, Ion: 1.11 mmol/L — ABNORMAL LOW (ref 1.15–1.40)
HCT: 41 % (ref 39.0–52.0)
Hemoglobin: 13.9 g/dL (ref 13.0–17.0)
O2 Saturation: 98 %
Potassium: 4.3 mmol/L (ref 3.5–5.1)
Sodium: 138 mmol/L (ref 135–145)
TCO2: 21 mmol/L — ABNORMAL LOW (ref 22–32)
pCO2 arterial: 40.7 mmHg (ref 32.0–48.0)
pH, Arterial: 7.298 — ABNORMAL LOW (ref 7.350–7.450)
pO2, Arterial: 126 mmHg — ABNORMAL HIGH (ref 83.0–108.0)

## 2020-12-30 LAB — POCT I-STAT EG7
Acid-base deficit: 5 mmol/L — ABNORMAL HIGH (ref 0.0–2.0)
Bicarbonate: 21.3 mmol/L (ref 20.0–28.0)
Calcium, Ion: 1.17 mmol/L (ref 1.15–1.40)
HCT: 42 % (ref 39.0–52.0)
Hemoglobin: 14.3 g/dL (ref 13.0–17.0)
O2 Saturation: 76 %
Potassium: 4.4 mmol/L (ref 3.5–5.1)
Sodium: 138 mmol/L (ref 135–145)
TCO2: 23 mmol/L (ref 22–32)
pCO2, Ven: 44.9 mmHg (ref 44.0–60.0)
pH, Ven: 7.285 (ref 7.250–7.430)
pO2, Ven: 45 mmHg (ref 32.0–45.0)

## 2020-12-30 LAB — GLUCOSE, CAPILLARY: Glucose-Capillary: 132 mg/dL — ABNORMAL HIGH (ref 70–99)

## 2020-12-30 SURGERY — RIGHT/LEFT HEART CATH AND CORONARY ANGIOGRAPHY
Anesthesia: LOCAL

## 2020-12-30 MED ORDER — LIDOCAINE HCL (PF) 1 % IJ SOLN
INTRAMUSCULAR | Status: DC | PRN
  Administered 2020-12-30 (×2): 2 mL via INTRADERMAL

## 2020-12-30 MED ORDER — HEPARIN (PORCINE) IN NACL 1000-0.9 UT/500ML-% IV SOLN
INTRAVENOUS | Status: AC
  Filled 2020-12-30: qty 500

## 2020-12-30 MED ORDER — ASPIRIN 81 MG PO CHEW: 81.0000 mg | CHEWABLE_TABLET | ORAL | Status: DC

## 2020-12-30 MED ORDER — HEPARIN SODIUM (PORCINE) 1000 UNIT/ML IJ SOLN
INTRAMUSCULAR | Status: DC | PRN
  Administered 2020-12-30: 4500 [IU] via INTRAVENOUS

## 2020-12-30 MED ORDER — METFORMIN HCL 500 MG PO TABS: 500.0000 mg | ORAL_TABLET | Freq: Two times a day (BID) | ORAL | Status: DC

## 2020-12-30 MED ORDER — SODIUM CHLORIDE 0.9% FLUSH: 3.0000 mL | INTRAVENOUS | Status: DC | PRN

## 2020-12-30 MED ORDER — VERAPAMIL HCL 2.5 MG/ML IV SOLN
INTRAVENOUS | Status: DC | PRN
  Administered 2020-12-30: 10 mL via INTRA_ARTERIAL

## 2020-12-30 MED ORDER — SODIUM CHLORIDE 0.9 % WEIGHT BASED INFUSION: 1.0000 mL/kg/h | INTRAVENOUS | Status: DC

## 2020-12-30 MED ORDER — SODIUM CHLORIDE 0.9 % IV SOLN: 250.0000 mL | INTRAVENOUS | Status: DC | PRN

## 2020-12-30 MED ORDER — FENTANYL CITRATE (PF) 100 MCG/2ML IJ SOLN
INTRAMUSCULAR | Status: DC | PRN
  Administered 2020-12-30: 25 ug via INTRAVENOUS

## 2020-12-30 MED ORDER — IOHEXOL 350 MG/ML SOLN
INTRAVENOUS | Status: DC | PRN
  Administered 2020-12-30: 70 mL via INTRA_ARTERIAL

## 2020-12-30 MED ORDER — HEPARIN (PORCINE) IN NACL 1000-0.9 UT/500ML-% IV SOLN
INTRAVENOUS | Status: DC | PRN
  Administered 2020-12-30 (×2): 500 mL

## 2020-12-30 MED ORDER — ONDANSETRON HCL 4 MG/2ML IJ SOLN: 4.0000 mg | Freq: Four times a day (QID) | INTRAMUSCULAR | Status: DC | PRN

## 2020-12-30 MED ORDER — SODIUM CHLORIDE 0.9 % IV SOLN: INTRAVENOUS | Status: DC

## 2020-12-30 MED ORDER — SODIUM CHLORIDE 0.9% FLUSH: 3.0000 mL | Freq: Two times a day (BID) | INTRAVENOUS | Status: DC

## 2020-12-30 MED ORDER — VERAPAMIL HCL 2.5 MG/ML IV SOLN
INTRAVENOUS | Status: AC
  Filled 2020-12-30: qty 2

## 2020-12-30 MED ORDER — LIDOCAINE HCL (PF) 1 % IJ SOLN
INTRAMUSCULAR | Status: AC
  Filled 2020-12-30: qty 30

## 2020-12-30 MED ORDER — ACETAMINOPHEN 325 MG PO TABS: 650.0000 mg | ORAL_TABLET | ORAL | Status: DC | PRN

## 2020-12-30 MED ORDER — MIDAZOLAM HCL 2 MG/2ML IJ SOLN
INTRAMUSCULAR | Status: DC | PRN
  Administered 2020-12-30: 1 mg via INTRAVENOUS

## 2020-12-30 MED ORDER — MIDAZOLAM HCL 2 MG/2ML IJ SOLN
INTRAMUSCULAR | Status: AC
  Filled 2020-12-30: qty 2

## 2020-12-30 MED ORDER — FENTANYL CITRATE (PF) 100 MCG/2ML IJ SOLN
INTRAMUSCULAR | Status: AC
  Filled 2020-12-30: qty 2

## 2020-12-30 SURGICAL SUPPLY — 12 items
CATH 5FR JL3.5 JR4 ANG PIG MP (CATHETERS) ×2 IMPLANT
CATH SWAN GANZ 7F STRAIGHT (CATHETERS) ×2 IMPLANT
COVER DOME SNAP 22 D (MISCELLANEOUS) ×2 IMPLANT
DEVICE RAD COMP TR BAND LRG (VASCULAR PRODUCTS) ×2 IMPLANT
GLIDESHEATH SLEND SS 6F .021 (SHEATH) ×2 IMPLANT
GLIDESHEATH SLENDER 7FR .021G (SHEATH) ×2 IMPLANT
GUIDEWIRE INQWIRE 1.5J.035X260 (WIRE) ×1 IMPLANT
INQWIRE 1.5J .035X260CM (WIRE) ×2
KIT HEART LEFT (KITS) ×2 IMPLANT
PACK CARDIAC CATHETERIZATION (CUSTOM PROCEDURE TRAY) ×2 IMPLANT
TRANSDUCER W/STOPCOCK (MISCELLANEOUS) ×2 IMPLANT
TUBING CIL FLEX 10 FLL-RA (TUBING) ×2 IMPLANT

## 2020-12-30 NOTE — Discharge Instructions (Signed)
Radial Site Care  This sheet gives you information about how to care for yourself after your procedure. Your health care provider may also give you more specific instructions. If you have problems or questions, contact your health care provider. What can I expect after the procedure? After the procedure, it is common to have:  Bruising and tenderness at the catheter insertion area. Follow these instructions at home: Medicines  Take over-the-counter and prescription medicines only as told by your health care provider. Insertion site care  Follow instructions from your health care provider about how to take care of your insertion site. Make sure you: ? Wash your hands with soap and water before you change your bandage (dressing). If soap and water are not available, use hand sanitizer. ? Change your dressing as told by your health care provider. ? Leave stitches (sutures), skin glue, or adhesive strips in place. These skin closures may need to stay in place for 2 weeks or longer. If adhesive strip edges start to loosen and curl up, you may trim the loose edges. Do not remove adhesive strips completely unless your health care provider tells you to do that.  Check your insertion site every day for signs of infection. Check for: ? Redness, swelling, or pain. ? Fluid or blood. ? Pus or a bad smell. ? Warmth.  Do not take baths, swim, or use a hot tub until your health care provider approves.  You may shower 24-48 hours after the procedure, or as directed by your health care provider. ? Remove the dressing and gently wash the site with plain soap and water. ? Pat the area dry with a clean towel. ? Do not rub the site. That could cause bleeding.  Do not apply powder or lotion to the site. Activity  For 24 hours after the procedure, or as directed by your health care provider: ? Do not flex or bend the affected arm. ? Do not push or pull heavy objects with the affected arm. ? Do not drive  yourself home from the hospital or clinic. You may drive 24 hours after the procedure unless your health care provider tells you not to. ? Do not operate machinery or power tools.  Do not lift anything that is heavier than 10 lb (4.5 kg), or the limit that you are told, until your health care provider says that it is safe.  Ask your health care provider when it is okay to: ? Return to work or school. ? Resume usual physical activities or sports. ? Resume sexual activity.   General instructions  If the catheter site starts to bleed, raise your arm and put firm pressure on the site. If the bleeding does not stop, get help right away. This is a medical emergency.  If you went home on the same day as your procedure, a responsible adult should be with you for the first 24 hours after you arrive home.  Keep all follow-up visits as told by your health care provider. This is important. Contact a health care provider if:  You have a fever.  You have redness, swelling, or yellow drainage around your insertion site. Get help right away if:  You have unusual pain at the radial site.  The catheter insertion area swells very fast.  The insertion area is bleeding, and the bleeding does not stop when you hold steady pressure on the area.  Your arm or hand becomes pale, cool, tingly, or numb. These symptoms may represent a serious   problem that is an emergency. Do not wait to see if the symptoms will go away. Get medical help right away. Call your local emergency services (911 in the [place] Do not drive yourself to the hospital. Summary  After the procedure, it is common to have bruising and tenderness at the site.  Follow instructions from your health care provider about how to take care of your radial site wound. Check the wound every day for signs of infection.  Do not lift anything that is heavier than 10 lb (4.5 kg), or the limit that you are told, until your health care provider says that it  is safe. This information is not intended to replace advice given to you by your health care provider. Make sure you discuss any questions you have with your health care provider. Document Revised: 08/22/2017 Document Reviewed: 08/22/2017 Elsevier Patient Education  2021 Elsevier Inc.  

## 2020-12-30 NOTE — Interval H&P Note (Signed)
History and Physical Interval Note:  12/30/2020 7:22 AM  Jonathan [name].  has presented today for surgery, with the diagnosis of CHF.  The various methods of treatment have been discussed with the patient and family. After consideration of risks, benefits and other options for treatment, the patient has consented to  Procedure(s): RIGHT/LEFT HEART CATH AND CORONARY ANGIOGRAPHY (N/A) as a surgical intervention.  The patient's history has been reviewed, patient examined, no change in status, stable for surgery.  I have reviewed the patient's chart and labs.  Questions were answered to the patient's satisfaction.     Collier Salina [facility] 12/30/2020 7:22 AM

## 2020-12-30 NOTE — Progress Notes (Signed)
TCTS consulted for outpatient CABG evaluation. TCTS office will call patient with an appointment.

## 2020-12-31 ENCOUNTER — Encounter (HOSPITAL_COMMUNITY): Payer: Self-pay | Admitting: Cardiology

## 2021-01-03 ENCOUNTER — Encounter: Payer: Medicare Other | Admitting: Thoracic Surgery (Cardiothoracic Vascular Surgery)

## 2021-01-03 NOTE — Progress Notes (Deleted)
Cardiology Office Note   Date:  01/03/2021   ID:  Jonathan Route., DOB [birth year], MRN [id number]  PCP:  Carolee Rota, NP  Cardiologist:   Macauley Mossberg Martinique, MD   No chief complaint on file.     History of Present Illness: Jonathan Krist. is a 70 y.o. male who is seen at the request of Carolee Rota NP for evaluation of CHF. He has a history of DM and HLD. He was recently admitted to [facility] 12/20/20 with acute  CHF. Pro BNP was 909. HS troponin 50>46>46. CXR and CT chest showed mild interstitial edema. No PE. Echo showed moderate to severe LV enlargement. EF 25-30%. Moderate MR and LAE. Normal RV function. Gr 1 diastolic dysfunction. He was diuresed 7 lbs. Seen today to establish cardiology care. He was discharged on lisinopril 2.5 mg daily only.   He reports he has not felt well for 2 months. Noted increasing SOB and feeling exhausted. He awoke suddenly Monday morning and couldn't breath and went to the ED. He does note some sharp pain in the chest when his heart beats. Denies any edema, palpitations, dizziness. He was just diagnosed with DM and HTN this year. He is a former smoker.   He underwent cardiac cath on 12/30/20 showing severe 3 vessel CAD and normal right heart and LV filling pressures. Referral to CT surgery made for consideration of CABG.     Past Medical History:  Diagnosis Date  . Cancer ([place])    skin on nose  . CHF (congestive heart failure) ([place])   . Diabetes mellitus without complication ([place])   . Hyperlipidemia   . Hypertension   . Kidney stones   . Sciatica     Past Surgical History:  Procedure Laterality Date  . BACK SURGERY    . LITHOTRIPSY    . RIGHT/LEFT HEART CATH AND CORONARY ANGIOGRAPHY N/A 12/30/2020   Procedure: RIGHT/LEFT HEART CATH AND CORONARY ANGIOGRAPHY;  Surgeon: Martinique, Martavis Gurney M, MD;  Location: [place] [facility];  Service: Cardiovascular;  Laterality: N/A;     Current Outpatient Medications  Medication Sig Dispense Refill   . albuterol (VENTOLIN HFA) 108 (90 Base) MCG/ACT inhaler Inhale 2 puffs into the lungs every 4 (four) hours as needed for shortness of breath or wheezing.    . cholecalciferol (VITAMIN D3) 25 MCG (1000 UNIT) tablet Take 1,000 Units by mouth daily.    . dapagliflozin propanediol (FARXIGA) 10 MG TABS tablet Take 1 tablet (10 mg total) by mouth daily before breakfast. 30 tablet 11  . furosemide (LASIX) 20 MG tablet Take 1 tablet (20 mg total) by mouth daily. 90 tablet 3  . LORazepam (ATIVAN) 0.5 MG tablet Take 0.5 mg by mouth daily.    . metFORMIN (GLUCOPHAGE) 500 MG tablet Take 1 tablet (500 mg total) by mouth 2 (two) times daily.    Marland Kitchen OVER THE COUNTER MEDICATION Take 1 tablet by mouth daily. Immune Plus    . rosuvastatin (CRESTOR) 20 MG tablet Take 1 tablet (20 mg total) by mouth daily. 90 tablet 3  . sacubitril-valsartan (ENTRESTO) 24-26 MG Take 1 tablet by mouth 2 (two) times daily. 60 tablet 11  . Saline (SIMPLY SALINE) 0.9 % AERS Place 1 spray into the nose every morning.     No current facility-administered medications for this visit.    Allergies:   Patient has no known allergies.    Social History:  The patient  reports that he quit smoking about  3 years ago. He has never used smokeless tobacco. He reports current alcohol use. He reports that he does not use drugs.   Family History:  The patient's family history includes COPD in his mother; Heart attack in his father; Heart failure in his mother.    ROS:  Please see the history of present illness.   Otherwise, review of systems are positive for none.   All other systems are reviewed and negative.    PHYSICAL EXAM: VS:  There were no vitals taken for this visit. , BMI There is no height or weight on file to calculate BMI. GEN: Well nourished, well developed, in no acute distress  HEENT: normal  Neck: no JVD, carotid bruits, or masses Cardiac: RRR; no murmurs, rubs, or gallops,no edema  Respiratory:  clear to auscultation  bilaterally, normal work of breathing GI: soft, nontender, nondistended, + BS MS: no deformity or atrophy  Skin: warm and dry, no rash Neuro:  Strength and sensation are intact Psych: euthymic mood, full affect   EKG:  EKG is ordered today. The ekg ordered today demonstrates NSR with rate 95 bpm. ST - T wave abnormality c/w inferolateral ischemia. I have personally reviewed and interpreted this study.    Recent Labs: 12/22/2020: BUN 27; Creatinine, Ser 0.87; Platelets 172 12/30/2020: Hemoglobin 14.3; Potassium 4.4; Sodium 138    Lipid Panel    Component Value Date/Time   CHOL 211 (H) 12/22/2020 1546   TRIG 226 (H) 12/22/2020 1546   HDL 37 (L) 12/22/2020 1546   CHOLHDL 5.7 (H) 12/22/2020 1546   LDLCALC 133 (H) 12/22/2020 1546     Dated 07/31/19 cholesterol 190, triglycerides 99, HDL 35, LDL 137.Hgb 14.5. Dated 12/16/20: A1c 9.1%. CMET normal Dated 12/20/20: D dimer 0.61. Coags normal. Normal swab for COVID and flu UA glucose > 1000. 15 mg/dl ketones.  CBC normal Glucose 307. Creatinine 0.97>>1.14. otherwise CMET normal.  Wt Readings from Last 3 Encounters:  12/30/20 200 lb (90.7 kg)  12/22/20 203 lb 3.2 oz (92.2 kg)  12/01/19 202 lb (91.6 kg)      Other studies Reviewed: Additional studies/ records that were reviewed today include:  CT chest 12/20/20: Impression Performed by [facility] 1. Negative for acute pulmonary embolus.  2. Basilar predominant pulmonary edema with small layering pleural  effusions. Mild superimposed Emphysema (ICD10-J43.9).  3. Calcified coronary artery and Aortic Atherosclerosis  (ICD10-I70.0).    Echo 12/21/20:Summary  1. Technically difficult study.  2. The left ventricle is moderately to severely dilated in size with normal  wall thickness.  3. The left ventricular systolic function is severely decreased, LVEF is  visually estimated at 25-30%.  4. There is grade I diastolic dysfunction (impaired relaxation).  5. The left atrium is  moderately dilated in size.  6. Degenerative appearing mitral valve with at least moderate regurgitation  (eccentric jet).  7. The right ventricle is normal in size, with normal systolic function.     Cardiac cath 12/30/20:  RIGHT/LEFT HEART CATH AND CORONARY ANGIOGRAPHY    Conclusion    Ost LM lesion is 35% stenosed.  Prox LAD lesion is 90% stenosed.  Mid LAD lesion is 70% stenosed.  1st Diag lesion is 50% stenosed.  Prox Cx to Mid Cx lesion is 99% stenosed.  Prox RCA lesion is 70% stenosed.  LV end diastolic pressure is normal.   1. Severe 3 vessel obstructive CAD 2. Normal LV filling pressures 3. Normal right heart pressures.  4. Normal cardiac output  Plan: recommend  referral to CT surgery for CABG. He has good targets. Given LV dysfunction, DM, and multivessel CAD his best option is CABG.     ASSESSMENT AND PLAN:  1.  Acute systolic CHF. EF 25-30%. Just DC from the hospital post diuresis. Multiple cardiac risk factors including DM, HTN, HLD and former smoker. Recommend [place]. Will arrange next week. The procedure and risks were reviewed including but not limited to death, myocardial infarction, stroke, arrythmias, bleeding, transfusion, emergency surgery, dye allergy, or renal dysfunction. The patient voices understanding and is agreeable to proceed.  Also recommend stopping lisinopril. Will start Entresto on Monday after being off ACEi for 3 days. 24/26 mg bid. Will also start Farxiga 10 mg daily. Will add lasix 20 mg daily. Plan to gradually optimize CHF therapy with beta blocker and aldactone as tolerated. Repeat Echo 3-4 months  2. CAD - severe 3 vessel disease. Recommend CABG.   3. DM poorly controlled. On metformin. Will add Farxiga 10 mg daily. Needs close follow up with primary care.  4. HLD. Will update lipid panel. Anticipate he will need a statin depending on results of cath  5. HTN. Will monitor with above medications.  6. Former smoker.     Current medicines are reviewed at length with the patient today.  The patient does not have concerns regarding medicines.  The following changes have been made:  See above  Labs/ tests ordered today include:   No orders of the defined types were placed in this encounter. [place].    Disposition:   FU post cardiac cath.   Signed, Dymond Spreen Martinique, MD  01/03/2021 1:11 PM    [place] [address] [place], [place], [address] Phone [phone], Fax [phone]

## 2021-01-05 ENCOUNTER — Institutional Professional Consult (permissible substitution) (INDEPENDENT_AMBULATORY_CARE_PROVIDER_SITE_OTHER): Payer: Medicare Other | Admitting: Thoracic Surgery (Cardiothoracic Vascular Surgery)

## 2021-01-05 ENCOUNTER — Other Ambulatory Visit: Payer: Self-pay

## 2021-01-05 ENCOUNTER — Other Ambulatory Visit: Payer: Self-pay | Admitting: *Deleted

## 2021-01-05 VITALS — BP 99/69 | HR 83 | Resp 20 | Ht 69.0 in | Wt 198.0 lb

## 2021-01-05 DIAGNOSIS — I2510 Atherosclerotic heart disease of native coronary artery without angina pectoris: Secondary | ICD-10-CM | POA: Diagnosis not present

## 2021-01-05 NOTE — H&P (View-Only) (Signed)
PCP is Carolee Rota, NP Referring Provider is Martinique, Peter M, MD  Chief Complaint  Patient presents with  . Coronary Artery Disease    Surgical eval, Cardiac Cath 12/30/20    HPI: Jonathan [name] is sent for consultation regarding three-vessel coronary disease.  Jonathan [name], Jonathan [name] is a 69 year old man with multiple cardiac risk factors including hypertension, hyperlipidemia, type 2 diabetes, and tobacco abuse.  He quit smoking 30 years ago.  He recently presented to [facility] with shortness of breath.  He was diagnosed with acute diastolic congestive heart failure.  He was diuresed and improved clinically.  He was discharged and followed up with Dr. Peter Martinique.  Dr. Martinique did a right and left heart catheterization on 12/30/2020.  It revealed three-vessel coronary disease with preserved systolic function and normal LVEDP and right-sided heart pressures.  He says he continues to have "indigestion" all the time.  Worse when he exerts himself.  Sometimes relieved by drinking some water.  Also complains of feeling fatigued.  Past Medical History:  Diagnosis Date  . Cancer ([place])    skin on nose  . CHF (congestive heart failure) ([place])   . Diabetes mellitus without complication ([place])   . Hyperlipidemia   . Hypertension   . Kidney stones   . Sciatica     Past Surgical History:  Procedure Laterality Date  . BACK SURGERY    . LITHOTRIPSY    . RIGHT/LEFT HEART CATH AND CORONARY ANGIOGRAPHY N/A 12/30/2020   Procedure: RIGHT/LEFT HEART CATH AND CORONARY ANGIOGRAPHY;  Surgeon: Martinique, Peter M, MD;  Location: [facility];  Service: Cardiovascular;  Laterality: N/A;    Family History  Problem Relation Age of Onset  . Heart failure Mother   . COPD Mother   . Heart attack Father     Social History Social History   Tobacco Use  . Smoking status: Former Smoker    Quit date: 12/22/2017    Years since quitting: 3.0  . Smokeless tobacco: Never Used  Vaping Use  . Vaping Use: Never used   Substance Use Topics  . Alcohol use: Yes  . Drug use: Never    Current Outpatient Medications  Medication Sig Dispense Refill  . albuterol (VENTOLIN HFA) 108 (90 Base) MCG/ACT inhaler Inhale 2 puffs into the lungs every 4 (four) hours as needed for shortness of breath or wheezing.    . cholecalciferol (VITAMIN D3) 25 MCG (1000 UNIT) tablet Take 1,000 Units by mouth daily.    . dapagliflozin propanediol (FARXIGA) 10 MG TABS tablet Take 1 tablet (10 mg total) by mouth daily before breakfast. 30 tablet 11  . furosemide (LASIX) 20 MG tablet Take 1 tablet (20 mg total) by mouth daily. 90 tablet 3  . LORazepam (ATIVAN) 0.5 MG tablet Take 0.5 mg by mouth daily.    . metFORMIN (GLUCOPHAGE) 500 MG tablet Take 1 tablet (500 mg total) by mouth 2 (two) times daily.    Marland Kitchen OVER THE COUNTER MEDICATION Take 1 tablet by mouth daily. Immune Plus    . rosuvastatin (CRESTOR) 20 MG tablet Take 1 tablet (20 mg total) by mouth daily. 90 tablet 3  . sacubitril-valsartan (ENTRESTO) 24-26 MG Take 1 tablet by mouth 2 (two) times daily. 60 tablet 11  . Saline (SIMPLY SALINE) 0.9 % AERS Place 1 spray into the nose every morning.     No current facility-administered medications for this visit.    No Known Allergies  Review of Systems  Constitutional: Positive for  activity change and fatigue. Negative for unexpected weight change.  HENT: Positive for trouble swallowing. Negative for voice change.   Respiratory: Positive for shortness of breath.   Cardiovascular: Positive for chest pain ("Indigestion").  Genitourinary: Negative for dysuria.  Neurological: Negative for syncope and weakness.       Chronic pain    There were no vitals taken for this visit. Physical Exam Vitals reviewed.  Constitutional:      General: He is not in acute distress.    Appearance: Normal appearance.  HENT:     Head: Normocephalic and atraumatic.  Eyes:     General: No scleral icterus.    Extraocular Movements: Extraocular  movements intact.  Neck:     Vascular: No carotid bruit.  Cardiovascular:     Rate and Rhythm: Normal rate and regular rhythm.     Heart sounds: Normal heart sounds. No murmur heard. No friction rub. No gallop.   Pulmonary:     Effort: Pulmonary effort is normal. No respiratory distress.     Breath sounds: Normal breath sounds. No wheezing or rales.  Abdominal:     General: There is no distension.     Palpations: Abdomen is soft.     Tenderness: There is no abdominal tenderness.  Musculoskeletal:        General: No swelling.     Cervical back: Neck supple.  Skin:    General: Skin is warm and dry.  Neurological:     General: No focal deficit present.     Mental Status: He is alert and oriented to person, place, and time.     Cranial Nerves: No cranial nerve deficit.     Motor: No weakness.     Diagnostic Tests: CARDIAC CATHETERIZATION Conclusion    Ost LM lesion is 35% stenosed.  Prox LAD lesion is 90% stenosed.  Mid LAD lesion is 70% stenosed.  1st Diag lesion is 50% stenosed.  Prox Cx to Mid Cx lesion is 99% stenosed.  Prox RCA lesion is 70% stenosed.  LV end diastolic pressure is normal.   1. Severe 3 vessel obstructive CAD 2. Normal LV filling pressures 3. Normal right heart pressures.  4. Normal cardiac output  Plan: recommend referral to CT surgery for CABG. He has good targets. Given LV dysfunction, DM, and multivessel CAD his best option is CABG.   I personally reviewed the cardiac catheterization images.  There is three-vessel disease with a tight proximal LAD lesion, 95% stenosis in the mid circumflex, and moderate disease in the RCA and large first diagonal.  Impression: Jonathan [name], Jonathan [name] is a 70 year old man with multiple cardiac risk factors recently admitted to [facility] with acute diastolic congestive heart failure.  He underwent cardiac catheterization which revealed severe three-vessel disease with a tight proximal LAD lesion and near  total occlusion of the mid circumflex.  There is moderate disease in the first diagonal and RCA.  I reviewed the catheterization images with Jonathan [name].  Coronary bypass grafting is indicated for survival benefit and relief of symptoms.  I discussed coronary artery bypass grafting with Jonathan [name].  We reviewed the general nature of the procedure, the need for general anesthesia, the incisions to be used, the use of cardiopulmonary bypass, use of drains to use postoperatively, the expected hospital stay, and the overall recovery.  I informed him of the indications, risks, benefits, and alternatives.  He understands the risks include, but not limited to death, MI, DVT, PE, bleeding, possible need for transfusion,  infection, cardiac arrhythmias, respiratory or renal failure, as well as possibility of other unforeseeable complications.  He accepts the risks and agrees to proceed.  Plan: Coronary artery bypass grafting x4 with grafts to LAD, diagonal, OM, and RCA on Monday, 01/10/2021  Melrose Nakayama, MD [facility] [phone]

## 2021-01-05 NOTE — Progress Notes (Signed)
PCP is Carolee Rota, NP Referring Provider is Martinique, Peter M, MD  Chief Complaint  Patient presents with  . Coronary Artery Disease    Surgical eval, Cardiac Cath 12/30/20    HPI: Mr. Macaraeg is sent for consultation regarding three-vessel coronary disease.  Anacleto, Batterman is a 70 year old man with multiple cardiac risk factors including hypertension, hyperlipidemia, type 2 diabetes, and tobacco abuse.  He quit smoking 30 years ago.  He recently presented to [facility] with shortness of breath.  He was diagnosed with acute diastolic congestive heart failure.  He was diuresed and improved clinically.  He was discharged and followed up with Dr. Peter Martinique.  Dr. Martinique did a right and left heart catheterization on 12/30/2020.  It revealed three-vessel coronary disease with preserved systolic function and normal LVEDP and right-sided heart pressures.  He says he continues to have "indigestion" all the time.  Worse when he exerts himself.  Sometimes relieved by drinking some water.  Also complains of feeling fatigued.  Past Medical History:  Diagnosis Date  . Cancer ([place])    skin on nose  . CHF (congestive heart failure) ([place])   . Diabetes mellitus without complication ([place])   . Hyperlipidemia   . Hypertension   . Kidney stones   . Sciatica     Past Surgical History:  Procedure Laterality Date  . BACK SURGERY    . LITHOTRIPSY    . RIGHT/LEFT HEART CATH AND CORONARY ANGIOGRAPHY N/A 12/30/2020   Procedure: RIGHT/LEFT HEART CATH AND CORONARY ANGIOGRAPHY;  Surgeon: Martinique, Peter M, MD;  Location: [place] CV LAB;  Service: Cardiovascular;  Laterality: N/A;    Family History  Problem Relation Age of Onset  . Heart failure Mother   . COPD Mother   . Heart attack Father     Social History Social History   Tobacco Use  . Smoking status: Former Smoker    Quit date: 12/22/2017    Years since quitting: 3.0  . Smokeless tobacco: Never Used  Vaping Use  . Vaping Use: Never used   Substance Use Topics  . Alcohol use: Yes  . Drug use: Never    Current Outpatient Medications  Medication Sig Dispense Refill  . albuterol (VENTOLIN HFA) 108 (90 Base) MCG/ACT inhaler Inhale 2 puffs into the lungs every 4 (four) hours as needed for shortness of breath or wheezing.    . cholecalciferol (VITAMIN D3) 25 MCG (1000 UNIT) tablet Take 1,000 Units by mouth daily.    . dapagliflozin propanediol (FARXIGA) 10 MG TABS tablet Take 1 tablet (10 mg total) by mouth daily before breakfast. 30 tablet 11  . furosemide (LASIX) 20 MG tablet Take 1 tablet (20 mg total) by mouth daily. 90 tablet 3  . LORazepam (ATIVAN) 0.5 MG tablet Take 0.5 mg by mouth daily.    . metFORMIN (GLUCOPHAGE) 500 MG tablet Take 1 tablet (500 mg total) by mouth 2 (two) times daily.    Marland Kitchen OVER THE COUNTER MEDICATION Take 1 tablet by mouth daily. Immune Plus    . rosuvastatin (CRESTOR) 20 MG tablet Take 1 tablet (20 mg total) by mouth daily. 90 tablet 3  . sacubitril-valsartan (ENTRESTO) 24-26 MG Take 1 tablet by mouth 2 (two) times daily. 60 tablet 11  . Saline (SIMPLY SALINE) 0.9 % AERS Place 1 spray into the nose every morning.     No current facility-administered medications for this visit.    No Known Allergies  Review of Systems  Constitutional: Positive for  activity change and fatigue. Negative for unexpected weight change.  HENT: Positive for trouble swallowing. Negative for voice change.   Respiratory: Positive for shortness of breath.   Cardiovascular: Positive for chest pain ("Indigestion").  Genitourinary: Negative for dysuria.  Neurological: Negative for syncope and weakness.       Chronic pain    There were no vitals taken for this visit. Physical Exam Vitals reviewed.  Constitutional:      General: He is not in acute distress.    Appearance: Normal appearance.  HENT:     Head: Normocephalic and atraumatic.  Eyes:     General: No scleral icterus.    Extraocular Movements: Extraocular  movements intact.  Neck:     Vascular: No carotid bruit.  Cardiovascular:     Rate and Rhythm: Normal rate and regular rhythm.     Heart sounds: Normal heart sounds. No murmur heard. No friction rub. No gallop.   Pulmonary:     Effort: Pulmonary effort is normal. No respiratory distress.     Breath sounds: Normal breath sounds. No wheezing or rales.  Abdominal:     General: There is no distension.     Palpations: Abdomen is soft.     Tenderness: There is no abdominal tenderness.  Musculoskeletal:        General: No swelling.     Cervical back: Neck supple.  Skin:    General: Skin is warm and dry.  Neurological:     General: No focal deficit present.     Mental Status: He is alert and oriented to person, place, and time.     Cranial Nerves: No cranial nerve deficit.     Motor: No weakness.     Diagnostic Tests: CARDIAC CATHETERIZATION Conclusion    Ost LM lesion is 35% stenosed.  Prox LAD lesion is 90% stenosed.  Mid LAD lesion is 70% stenosed.  1st Diag lesion is 50% stenosed.  Prox Cx to Mid Cx lesion is 99% stenosed.  Prox RCA lesion is 70% stenosed.  LV end diastolic pressure is normal.   1. Severe 3 vessel obstructive CAD 2. Normal LV filling pressures 3. Normal right heart pressures.  4. Normal cardiac output  Plan: recommend referral to CT surgery for CABG. He has good targets. Given LV dysfunction, DM, and multivessel CAD his best option is CABG.   I personally reviewed the cardiac catheterization images.  There is three-vessel disease with a tight proximal LAD lesion, 95% stenosis in the mid circumflex, and moderate disease in the RCA and large first diagonal.  Impression: Kirill, Chatterjee is a 69 year old man with multiple cardiac risk factors recently admitted to [facility] with acute diastolic congestive heart failure.  He underwent cardiac catheterization which revealed severe three-vessel disease with a tight proximal LAD lesion and near  total occlusion of the mid circumflex.  There is moderate disease in the first diagonal and RCA.  I reviewed the catheterization images with Mr. Grupe.  Coronary bypass grafting is indicated for survival benefit and relief of symptoms.  I discussed coronary artery bypass grafting with Mr. Brucato.  We reviewed the general nature of the procedure, the need for general anesthesia, the incisions to be used, the use of cardiopulmonary bypass, use of drains to use postoperatively, the expected hospital stay, and the overall recovery.  I informed him of the indications, risks, benefits, and alternatives.  He understands the risks include, but not limited to death, MI, DVT, PE, bleeding, possible need for transfusion,  infection, cardiac arrhythmias, respiratory or renal failure, as well as possibility of other unforeseeable complications.  He accepts the risks and agrees to proceed.  Plan: Coronary artery bypass grafting x4 with grafts to LAD, diagonal, OM, and RCA on Monday, 01/10/2021  Melrose Nakayama, MD [facility] [phone]

## 2021-01-06 NOTE — Pre-Procedure Instructions (Signed)
Surgical Instructions    Your procedure is scheduled on Monday, June 13th.  Report to [facility] Main Entrance "A" at 5:30 A.M., then check in with the Admitting office.  Call this number if you have problems the morning of surgery:  [phone]   If you have any questions prior to your surgery date call [phone]: Open Monday-Friday 8am-4pm    Remember:  Do not eat or drink after midnight the night before your surgery    Take these medicines the morning of surgery with A SIP OF WATER  LORazepam (ATIVAN) rosuvastatin (CRESTOR) Nasal spray Albuterol inhaler-as needed; please bring on the day of surgery.  As of today, STOP taking any Aspirin (unless otherwise instructed by your surgeon) Aleve, Naproxen, Ibuprofen, Motrin, Advil, Goody's, BC's, all herbal medications, fish oil, and all vitamins.          WHAT DO I DO ABOUT MY DIABETES MEDICATION?  Do not take metFORMIN (GLUCOPHAGE) the morning of surgery.  HOLD dapagliflozin propanediol (FARXIGA) the day before surgery (01/09/21) and the day of surgery (01/10/21).   HOW TO MANAGE YOUR DIABETES BEFORE AND AFTER SURGERY  Why is it important to control my blood sugar before and after surgery? Improving blood sugar levels before and after surgery helps healing and can limit problems. A way of improving blood sugar control is eating a healthy diet by:  Eating less sugar and carbohydrates  Increasing activity/exercise  Talking with your doctor about reaching your blood sugar goals High blood sugars (greater than 180 mg/dL) can raise your risk of infections and slow your recovery, so you will need to focus on controlling your diabetes during the weeks before surgery. Make sure that the doctor who takes care of your diabetes knows about your planned surgery including the date and location.  How do I manage my blood sugar before surgery? Check your blood sugar at least 4 times a day, starting 2 days before surgery, to make sure that  the level is not too high or low.  Check your blood sugar the morning of your surgery when you wake up and every 2 hours until you get to the Short Stay unit.  If your blood sugar is less than 70 mg/dL, you will need to treat for low blood sugar: Do not take insulin. Treat a low blood sugar (less than 70 mg/dL) with  cup of clear juice (cranberry or apple), 4 glucose tablets, OR glucose gel. Recheck blood sugar in 15 minutes after treatment (to make sure it is greater than 70 mg/dL). If your blood sugar is not greater than 70 mg/dL on recheck, call [phone] for further instructions. Report your blood sugar to the short stay nurse when you get to Short Stay.  If you are admitted to the hospital after surgery: Your blood sugar will be checked by the staff and you will probably be given insulin after surgery (instead of oral diabetes medicines) to make sure you have good blood sugar levels. The goal for blood sugar control after surgery is 80-180 mg/dL.             Do NOT Smoke (Tobacco/Vaping) or drink Alcohol 24 hours prior to your procedure.  If you use a CPAP at night, you may bring all equipment for your overnight stay.   Contacts, glasses, piercing's, hearing aid's, dentures or partials may not be worn into surgery, please bring cases for these belongings.    For patients admitted to the hospital, discharge time will be determined by  your treatment team.   Patients discharged the day of surgery will not be allowed to drive home, and someone needs to stay with them for 24 hours.    Special instructions:   [place]- Preparing For Surgery  Before surgery, you can play an important role. Because skin is not sterile, your skin needs to be as free of germs as possible. You can reduce the number of germs on your skin by washing with CHG (chlorahexidine gluconate) Soap before surgery.  CHG is an antiseptic cleaner which kills germs and bonds with the skin to continue killing germs  even after washing.    Oral Hygiene is also important to reduce your risk of infection.  Remember - BRUSH YOUR TEETH THE MORNING OF SURGERY WITH YOUR REGULAR TOOTHPASTE  Please do not use if you have an allergy to CHG or antibacterial soaps. If your skin becomes reddened/irritated stop using the CHG.  Do not shave (including legs and underarms) for at least 48 hours prior to first CHG shower. It is OK to shave your face.  Please follow these instructions carefully.   Shower the NIGHT BEFORE SURGERY and the MORNING OF SURGERY  If you chose to wash your hair, wash your hair first as usual with your normal shampoo.  After you shampoo, rinse your hair and body thoroughly to remove the shampoo.  Use CHG Soap as you would any other liquid soap. You can apply CHG directly to the skin and wash gently with a scrungie or a clean washcloth.   Apply the CHG Soap to your body ONLY FROM THE NECK DOWN.  Do not use on open wounds or open sores. Avoid contact with your eyes, ears, mouth and genitals (private parts). Wash Face and genitals (private parts)  with your normal soap.   Wash thoroughly, paying special attention to the area where your surgery will be performed.  Thoroughly rinse your body with warm water from the neck down.  DO NOT shower/wash with your normal soap after using and rinsing off the CHG Soap.  Pat yourself dry with a CLEAN TOWEL.  Wear CLEAN PAJAMAS to bed the night before surgery  Place CLEAN SHEETS on your bed the night before your surgery  DO NOT SLEEP WITH PETS.   Day of Surgery: Shower with CHG soap. Do not wear jewelry. Do not wear lotions, powders, colognes, or deodorant. Men may shave face and neck. Do not bring valuables to the hospital. [facility] is not responsible for any belongings or valuables. Wear Clean/Comfortable clothing the morning of surgery Remember to brush your teeth WITH YOUR REGULAR TOOTHPASTE.   Please read over the following fact sheets  that you were given.

## 2021-01-07 ENCOUNTER — Ambulatory Visit (HOSPITAL_BASED_OUTPATIENT_CLINIC_OR_DEPARTMENT_OTHER)
Admission: RE | Admit: 2021-01-07 | Discharge: 2021-01-07 | Disposition: A | Discharge status: Home / Self Care | Payer: Medicare Other | Source: Ambulatory Visit | Attending: Thoracic Surgery (Cardiothoracic Vascular Surgery) | Admitting: Thoracic Surgery (Cardiothoracic Vascular Surgery)

## 2021-01-07 ENCOUNTER — Encounter (HOSPITAL_COMMUNITY): Payer: Self-pay

## 2021-01-07 ENCOUNTER — Encounter (HOSPITAL_COMMUNITY)
Admission: RE | Admit: 2021-01-07 | Discharge: 2021-01-07 | Disposition: A | Discharge status: Home / Self Care | Payer: Medicare Other | Source: Ambulatory Visit | Attending: Thoracic Surgery (Cardiothoracic Vascular Surgery) | Admitting: Thoracic Surgery (Cardiothoracic Vascular Surgery)

## 2021-01-07 ENCOUNTER — Other Ambulatory Visit: Payer: Self-pay

## 2021-01-07 DIAGNOSIS — Z7984 Long term (current) use of oral hypoglycemic drugs: Secondary | ICD-10-CM | POA: Diagnosis not present

## 2021-01-07 DIAGNOSIS — K449 Diaphragmatic hernia without obstruction or gangrene: Secondary | ICD-10-CM | POA: Diagnosis not present

## 2021-01-07 DIAGNOSIS — I454 Nonspecific intraventricular block: Secondary | ICD-10-CM | POA: Diagnosis not present

## 2021-01-07 DIAGNOSIS — E11649 Type 2 diabetes mellitus with hypoglycemia without coma: Secondary | ICD-10-CM | POA: Diagnosis not present

## 2021-01-07 DIAGNOSIS — J9811 Atelectasis: Secondary | ICD-10-CM | POA: Diagnosis not present

## 2021-01-07 DIAGNOSIS — I2510 Atherosclerotic heart disease of native coronary artery without angina pectoris: Secondary | ICD-10-CM

## 2021-01-07 DIAGNOSIS — I340 Nonrheumatic mitral (valve) insufficiency: Secondary | ICD-10-CM | POA: Diagnosis not present

## 2021-01-07 DIAGNOSIS — R000 Tachycardia, unspecified: Secondary | ICD-10-CM | POA: Diagnosis not present

## 2021-01-07 DIAGNOSIS — E119 Type 2 diabetes mellitus without complications: Secondary | ICD-10-CM | POA: Insufficient documentation

## 2021-01-07 DIAGNOSIS — D62 Acute posthemorrhagic anemia: Secondary | ICD-10-CM | POA: Diagnosis not present

## 2021-01-07 DIAGNOSIS — Z20822 Contact with and (suspected) exposure to covid-19: Secondary | ICD-10-CM | POA: Diagnosis not present

## 2021-01-07 DIAGNOSIS — Z87891 Personal history of nicotine dependence: Secondary | ICD-10-CM | POA: Diagnosis not present

## 2021-01-07 DIAGNOSIS — J9382 Other air leak: Secondary | ICD-10-CM | POA: Diagnosis not present

## 2021-01-07 DIAGNOSIS — Z8249 Family history of ischemic heart disease and other diseases of the circulatory system: Secondary | ICD-10-CM | POA: Diagnosis not present

## 2021-01-07 DIAGNOSIS — R1110 Vomiting, unspecified: Secondary | ICD-10-CM | POA: Diagnosis not present

## 2021-01-07 DIAGNOSIS — Z01818 Encounter for other preprocedural examination: Secondary | ICD-10-CM | POA: Diagnosis not present

## 2021-01-07 DIAGNOSIS — G540 Brachial plexus disorders: Secondary | ICD-10-CM | POA: Diagnosis not present

## 2021-01-07 DIAGNOSIS — Z87442 Personal history of urinary calculi: Secondary | ICD-10-CM | POA: Diagnosis not present

## 2021-01-07 DIAGNOSIS — I10 Essential (primary) hypertension: Secondary | ICD-10-CM | POA: Insufficient documentation

## 2021-01-07 DIAGNOSIS — D6959 Other secondary thrombocytopenia: Secondary | ICD-10-CM | POA: Diagnosis not present

## 2021-01-07 DIAGNOSIS — Z85828 Personal history of other malignant neoplasm of skin: Secondary | ICD-10-CM | POA: Diagnosis not present

## 2021-01-07 DIAGNOSIS — Z825 Family history of asthma and other chronic lower respiratory diseases: Secondary | ICD-10-CM | POA: Diagnosis not present

## 2021-01-07 DIAGNOSIS — E785 Hyperlipidemia, unspecified: Secondary | ICD-10-CM | POA: Insufficient documentation

## 2021-01-07 DIAGNOSIS — Z79899 Other long term (current) drug therapy: Secondary | ICD-10-CM | POA: Diagnosis not present

## 2021-01-07 HISTORY — DX: Atherosclerotic heart disease of native coronary artery without angina pectoris: I25.10

## 2021-01-07 HISTORY — DX: Personal history of urinary calculi: Z87.442

## 2021-01-07 LAB — CBC
HCT: 44.3 % (ref 39.0–52.0)
Hemoglobin: 14.9 g/dL (ref 13.0–17.0)
MCH: 31.4 pg (ref 26.0–34.0)
MCHC: 33.6 g/dL (ref 30.0–36.0)
MCV: 93.3 fL (ref 80.0–100.0)
Platelets: 161 10*3/uL (ref 150–400)
RBC: 4.75 MIL/uL (ref 4.22–5.81)
RDW: 12.9 % (ref 11.5–15.5)
WBC: 7.4 10*3/uL (ref 4.0–10.5)
nRBC: 0 % (ref 0.0–0.2)

## 2021-01-07 LAB — URINALYSIS, ROUTINE W REFLEX MICROSCOPIC
Bacteria, UA: NONE SEEN
Bilirubin Urine: NEGATIVE
Glucose, UA: >=500 mg/dL — AB
Hgb urine dipstick: NEGATIVE
Ketones, ur: NEGATIVE mg/dL
Leukocytes,Ua: NEGATIVE
Nitrite: NEGATIVE
Protein, ur: NEGATIVE mg/dL
Specific Gravity, Urine: 1.027 (ref 1.005–1.030)
pH: 5 (ref 5.0–8.0)

## 2021-01-07 LAB — TYPE AND SCREEN
ABO/RH(D): A POS
Antibody Screen: NEGATIVE

## 2021-01-07 LAB — COMPREHENSIVE METABOLIC PANEL
ALT: 35 U/L (ref 0–44)
AST: 27 U/L (ref 15–41)
Albumin: 4 g/dL (ref 3.5–5.0)
Alkaline Phosphatase: 37 U/L — ABNORMAL LOW (ref 38–126)
Anion gap: 10 (ref 5–15)
BUN: 17 mg/dL (ref 8–23)
CO2: 20 mmol/L — ABNORMAL LOW (ref 22–32)
Calcium: 9 mg/dL (ref 8.9–10.3)
Chloride: 106 mmol/L (ref 98–111)
Creatinine, Ser: 0.77 mg/dL (ref 0.61–1.24)
GFR, Estimated: >60 mL/min (ref >60)
Glucose, Bld: 149 mg/dL — ABNORMAL HIGH (ref 70–99)
Potassium: 4 mmol/L (ref 3.5–5.1)
Sodium: 136 mmol/L (ref 135–145)
Total Bilirubin: 0.7 mg/dL (ref 0.3–1.2)
Total Protein: 6.9 g/dL (ref 6.5–8.1)

## 2021-01-07 LAB — PROTIME-INR
INR: 1 (ref 0.8–1.2)
Prothrombin Time: 13.3 seconds (ref 11.4–15.2)

## 2021-01-07 LAB — BLOOD GAS, ARTERIAL
Acid-base deficit: 1.7 mmol/L (ref 0.0–2.0)
Bicarbonate: 22.4 mmol/L (ref 20.0–28.0)
FIO2: 21
O2 Saturation: 98.8 %
Patient temperature: 37
pCO2 arterial: 36.8 mmHg (ref 32.0–48.0)
pH, Arterial: 7.402 (ref 7.350–7.450)
pO2, Arterial: 128 mmHg — ABNORMAL HIGH (ref 83.0–108.0)

## 2021-01-07 LAB — APTT: aPTT: 32 seconds (ref 24–36)

## 2021-01-07 LAB — SURGICAL PCR SCREEN
MRSA, PCR: NEGATIVE
Staphylococcus aureus: POSITIVE — AB

## 2021-01-07 LAB — GLUCOSE, CAPILLARY: Glucose-Capillary: 149 mg/dL — ABNORMAL HIGH (ref 70–99)

## 2021-01-07 LAB — SARS CORONAVIRUS 2 (TAT 6-24 HRS): SARS Coronavirus 2: NEGATIVE

## 2021-01-07 MED ORDER — TRANEXAMIC ACID 1000 MG/10ML IV SOLN
1.5000 mg/kg/h | INTRAVENOUS | Status: AC
  Administered 2021-01-10: 1.5 mg/kg/h via INTRAVENOUS
  Filled 2021-01-07: qty 25

## 2021-01-07 MED ORDER — MILRINONE LACTATE IN DEXTROSE 20-5 MG/100ML-% IV SOLN
0.3000 ug/kg/min | INTRAVENOUS | Status: AC
  Administered 2021-01-10: .25 ug/kg/min via INTRAVENOUS
  Administered 2021-01-10: 0.252 ug/kg/min via INTRAVENOUS
  Filled 2021-01-07: qty 100

## 2021-01-07 MED ORDER — CEFAZOLIN SODIUM-DEXTROSE 2-4 GM/100ML-% IV SOLN
2.0000 g | INTRAVENOUS | Status: DC
  Filled 2021-01-07: qty 100

## 2021-01-07 MED ORDER — PLASMA-LYTE A IV SOLN
INTRAVENOUS | Status: DC
  Filled 2021-01-07: qty 5

## 2021-01-07 MED ORDER — POTASSIUM CHLORIDE 2 MEQ/ML IV SOLN
80.0000 meq | INTRAVENOUS | Status: DC
  Filled 2021-01-07: qty 40

## 2021-01-07 MED ORDER — NITROGLYCERIN IN D5W 200-5 MCG/ML-% IV SOLN
2.0000 ug/min | INTRAVENOUS | Status: DC
  Filled 2021-01-07: qty 250

## 2021-01-07 MED ORDER — TRANEXAMIC ACID (OHS) PUMP PRIME SOLUTION
2.0000 mg/kg | INTRAVENOUS | Status: DC
  Filled 2021-01-07: qty 1.8

## 2021-01-07 MED ORDER — DEXMEDETOMIDINE HCL IN NACL 400 MCG/100ML IV SOLN
0.1000 ug/kg/h | INTRAVENOUS | Status: AC
  Administered 2021-01-10: .7 ug/kg/h via INTRAVENOUS
  Filled 2021-01-07: qty 100

## 2021-01-07 MED ORDER — EPINEPHRINE HCL 5 MG/250ML IV SOLN IN NS
0.0000 ug/min | INTRAVENOUS | Status: AC
Start: 2021-01-10 — End: 2021-01-10
  Administered 2021-01-10: 1 ug/min via INTRAVENOUS
  Filled 2021-01-07: qty 250

## 2021-01-07 MED ORDER — NOREPINEPHRINE 4 MG/250ML-% IV SOLN
0.0000 ug/min | INTRAVENOUS | Status: DC
  Filled 2021-01-07: qty 250

## 2021-01-07 MED ORDER — TRANEXAMIC ACID (OHS) BOLUS VIA INFUSION
15.0000 mg/kg | INTRAVENOUS | Status: AC
  Administered 2021-01-10: 1347 mg via INTRAVENOUS
  Filled 2021-01-07: qty 1347

## 2021-01-07 MED ORDER — VANCOMYCIN HCL 1500 MG/300ML IV SOLN
1500.0000 mg | INTRAVENOUS | Status: AC
  Administered 2021-01-10: 1500 mg via INTRAVENOUS
  Filled 2021-01-07: qty 300

## 2021-01-07 MED ORDER — SODIUM CHLORIDE 0.9 % IV SOLN
INTRAVENOUS | Status: DC
  Filled 2021-01-07: qty 30

## 2021-01-07 MED ORDER — PHENYLEPHRINE HCL-NACL 20-0.9 MG/250ML-% IV SOLN
30.0000 ug/min | INTRAVENOUS | Status: AC
  Administered 2021-01-10: 25 ug/min via INTRAVENOUS
  Filled 2021-01-07: qty 250

## 2021-01-07 MED ORDER — MAGNESIUM SULFATE 50 % IJ SOLN
40.0000 meq | INTRAMUSCULAR | Status: DC
  Filled 2021-01-07: qty 9.85

## 2021-01-07 MED ORDER — CEFAZOLIN SODIUM-DEXTROSE 2-4 GM/100ML-% IV SOLN
2.0000 g | INTRAVENOUS | Status: AC
  Administered 2021-01-10 (×2): 2 g via INTRAVENOUS
  Filled 2021-01-07: qty 100

## 2021-01-07 MED ORDER — INSULIN REGULAR(HUMAN) IN NACL 100-0.9 UT/100ML-% IV SOLN
INTRAVENOUS | Status: AC
  Administered 2021-01-10: 3 [IU]/h via INTRAVENOUS
  Filled 2021-01-07: qty 100

## 2021-01-07 NOTE — Progress Notes (Signed)
PCP - Carolee Rota, NP Cardiologist - Dr. Peter Martinique  Chest x-ray - 01/07/21 EKG - 12/22/20 Stress Test - denies ECHO - 12/21/20 (CE) Cardiac Cath - 12/30/20  Sleep Study - denies; pt had a pending sleep study but is now postponed until after heart surgery. CPAP - denies  Blood Thinner Instructions: n/a Aspirin Instructions: n/a  COVID TEST- 01/07/21  Anesthesia review: Yes, hx of CAD  Patient denies shortness of breath, fever, cough and chest pain at PAT appointment   All instructions explained to the patient, with a verbal understanding of the material. Patient agrees to go over the instructions while at home for a better understanding. Patient also instructed to self quarantine after being tested for COVID-19. The opportunity to ask questions was provided.

## 2021-01-07 NOTE — Progress Notes (Signed)
   01/07/21 1328  OBSTRUCTIVE SLEEP APNEA  Have you ever been diagnosed with sleep apnea through a sleep study? No  Do you snore loudly (loud enough to be heard through closed doors)?  1  Do you often feel tired, fatigued, or sleepy during the daytime (such as falling asleep during driving or talking to someone)? 0  Has anyone observed you stop breathing during your sleep? 1  Do you have, or are you being treated for high blood pressure? 1  BMI more than 35 kg/m2? 0  Age > 50 (1-yes) 1  Neck circumference greater than:Male 16 inches or larger, Male 17inches or larger? 0  Male Gender (Yes=1) 1  Obstructive Sleep Apnea Score 5    PCP is already aware and pt was pending a sleep study. New onset of symptoms with SOB is what caused pt to seek medical attention and pt was diagnosed with CHF. Pt will resume study after heart surgery if still needed.

## 2021-01-07 NOTE — Progress Notes (Signed)
Anesthesia Chart Review:  Case: [id number] Date/Time: 01/10/21 0715   Procedures:      CORONARY ARTERY BYPASS GRAFTING (CABG) (Chest)     TRANSESOPHAGEAL ECHOCARDIOGRAM (TEE)   Anesthesia type: General   Pre-op diagnosis: CAD   Location: [facility] OR ROOM 15 / [place] [facility]   Surgeons: Melrose Nakayama, MD       DISCUSSION: Jonathan [name] is a 70 year old male scheduled for the above procedure.  History includes former smoker (quit 12/22/17), CAD, CHF (admission [facility] 11/2020), DM2, HTN, cancer (skin, nose),  back surgery. OSA screen 5, reportedly with pending sleep study.   01/07/21 COVID-19 test, A1c, and CXR are in process. Anesthesia team to evaluate on the day of surgery.     VS: BP 104/73   Pulse 92   Temp 36.5 C (Axillary)   Resp 18   Ht 5\' 9"  (1.753 m)   Wt 91.6 kg   SpO2 97%   BMI 29.82 kg/m    PROVIDERS: Carolee Rota, NP is PCP  Martinique, Peter, MD is cardiologist   LABS: Labs reviewed: Acceptable for surgery. A1c in process.  (all labs ordered are listed, but only abnormal results are displayed)  Labs Reviewed  GLUCOSE, CAPILLARY - Abnormal; Notable for the following components:      Result Value   Glucose-Capillary 149 (*)    All other components within normal limits  COMPREHENSIVE METABOLIC PANEL - Abnormal; Notable for the following components:   CO2 20 (*)    Glucose, Bld 149 (*)    Alkaline Phosphatase 37 (*)    All other components within normal limits  URINALYSIS, ROUTINE W REFLEX MICROSCOPIC - Abnormal; Notable for the following components:   Glucose, UA >=500 (*)    All other components within normal limits  BLOOD GAS, ARTERIAL - Abnormal; Notable for the following components:   pO2, Arterial 128 (*)    Allens test (pass/fail) BRACHIAL ARTERY (*)    All other components within normal limits  SARS CORONAVIRUS 2 (TAT 6-24 HRS)  SURGICAL PCR SCREEN  CBC  PROTIME-INR  APTT  HEMOGLOBIN A1C  TYPE AND SCREEN     IMAGES: CXR 01/07/21: In process.  CTA  Chest 12/20/20 [facility]): Impression: 1. Negative for acute pulmonary embolus.  2. Basilar predominant pulmonary edema with small layering pleural  effusions. Mild superimposed Emphysema (ICD10-J43.9).  3. Calcified coronary artery and Aortic Atherosclerosis  (ICD10-I70.0).     EKG: 12/22/20: Normal sinus rhythm Possible left atrial margin ST and T wave abnormality, consider inferolateral ischemia Prolonged QT (QT 392, QTc 492 ms)   CV: Cardiac cath 12/30/20: Colon Flattery LM lesion is 35% stenosed. Prox LAD lesion is 90% stenosed. Mid LAD lesion is 70% stenosed. 1st Diag lesion is 50% stenosed. Prox Cx to Mid Cx lesion is 99% stenosed. Prox RCA lesion is 70% stenosed. LV end diastolic pressure is normal.   1. Severe 3 vessel obstructive CAD 2. Normal LV filling pressures 3. Normal right heart pressures. 4. Normal cardiac output   Plan: recommend referral to CT surgery for CABG. He has good targets. Given LV dysfunction, DM, and multivessel CAD his best option is CABG.   Echo 12/21/20:([facility]): Summary    1. Technically difficult study.    2. The left ventricle is moderately to severely dilated in size with normal  wall thickness.    3. The left ventricular systolic function is severely decreased, LVEF is  visually estimated at 25-30%.    4. There is grade I diastolic dysfunction (impaired  relaxation).    5. The left atrium is moderately dilated in size.    6. Degenerative appearing mitral valve with at least moderate regurgitation  (eccentric jet).    7. The right ventricle is normal in size, with normal systolic function.     Past Medical History:  Diagnosis Date   Cancer ([place])    skin on nose   CHF (congestive heart failure) ([facility])    Coronary artery disease    Diabetes mellitus without complication ([place])    History of kidney stones    Hyperlipidemia    Hypertension    Kidney stones    Sciatica     Past Surgical History:  Procedure Laterality Date   BACK SURGERY      COLONOSCOPY     LITHOTRIPSY     NOSE SURGERY Left 02/28/2017   skin reconstruction of nose from skin cancer   RIGHT/LEFT HEART CATH AND CORONARY ANGIOGRAPHY N/A 12/30/2020   Procedure: RIGHT/LEFT HEART CATH AND CORONARY ANGIOGRAPHY;  Surgeon: Martinique, Peter M, MD;  Location: [place] CV LAB;  Service: Cardiovascular;  Laterality: N/A;    MEDICATIONS:  albuterol (VENTOLIN HFA) 108 (90 Base) MCG/ACT inhaler   cholecalciferol (VITAMIN D3) 25 MCG (1000 UNIT) tablet   dapagliflozin propanediol (FARXIGA) 10 MG TABS tablet   furosemide (LASIX) 20 MG tablet   LORazepam (ATIVAN) 0.5 MG tablet   metFORMIN (GLUCOPHAGE) 500 MG tablet   OVER THE COUNTER MEDICATION   rosuvastatin (CRESTOR) 20 MG tablet   sacubitril-valsartan (ENTRESTO) 24-26 MG   Saline (SIMPLY SALINE) 0.9 % AERS   No current facility-administered medications for this encounter.    [START ON 01/10/2021] ceFAZolin (ANCEF) IVPB 2g/100 mL premix   [START ON 01/10/2021] ceFAZolin (ANCEF) IVPB 2g/100 mL premix   [START ON 01/10/2021] dexmedetomidine (PRECEDEX) 400 MCG/100ML (4 mcg/mL) infusion   [START ON 01/10/2021] EPINEPHrine (ADRENALIN) 5 mg in NS 250 mL (0.02 mg/mL) premix infusion   [START ON 01/10/2021] heparin 30,000 units/NS 1000 mL solution for CELLSAVER   [START ON 01/10/2021] heparin sodium (porcine) 5,000 Units, papaverine 60 mg in electrolyte-A (PLASMALYTE-A PH 7.4) 1,000 mL irrigation   [START ON 01/10/2021] insulin regular, human (MYXREDLIN) 100 units/ 100 mL infusion   [START ON 01/10/2021] magnesium sulfate (IV Push/IM) injection 40 mEq   [START ON 01/10/2021] milrinone (PRIMACOR) 20 MG/100 ML (0.2 mg/mL) infusion   [START ON 01/10/2021] nitroGLYCERIN 50 mg in dextrose 5 % 250 mL (0.2 mg/mL) infusion   [START ON 01/10/2021] norepinephrine (LEVOPHED) 4mg  in 220mL premix infusion   [START ON 01/10/2021] phenylephrine (NEOSYNEPHRINE) 20-0.9 MG/250ML-% infusion   [START ON 01/10/2021] potassium chloride injection 80 mEq   [START  ON 01/10/2021] tranexamic acid (CYKLOKAPRON) 2,500 mg in sodium chloride 0.9 % 250 mL (10 mg/mL) infusion   [START ON 01/10/2021] tranexamic acid (CYKLOKAPRON) bolus via infusion - over 30 minutes 1,347 mg   [START ON 01/10/2021] tranexamic acid (CYKLOKAPRON) pump prime solution 180 mg   [START ON 01/10/2021] vancomycin (VANCOREADY) IVPB 1500 mg/300 mL    Myra Gianotti, PA-C Surgical Short Stay/Anesthesiology [facility] Phone [phone] [facility] Phone [phone] 01/07/2021 3:34 PM

## 2021-01-07 NOTE — Progress Notes (Signed)
Pre cabg has been completed.   Preliminary results in CV Proc.   Abram Sander 01/07/2021 3:52 PM

## 2021-01-09 NOTE — Anesthesia Preprocedure Evaluation (Addendum)
Anesthesia Evaluation  Patient identified by MRN, date of birth, ID band Patient awake    Reviewed: Allergy & Precautions, NPO status , Patient's Chart, lab work & pertinent test results  Airway Mallampati: II  TM Distance: >3 FB Neck ROM: Full    Dental  (+) Dental Advisory Given, Missing, Chipped, Partial Upper, Partial Lower   Pulmonary neg pulmonary ROS, former smoker,    Pulmonary exam normal breath sounds clear to auscultation       Cardiovascular hypertension, Pt. on medications + CAD and +CHF  Normal cardiovascular exam Rhythm:Regular Rate:Normal   Echo 12/21/20:([facility]): Summary  1. Technically difficult study.  2. The left ventricle is moderately to severely dilated in size with normal wall thickness.  3. The left ventricular systolic function is severely decreased, LVEF is visually estimated at 25-30%.  4. There is grade I diastolic dysfunction (impaired relaxation).  5. The left atrium is moderately dilated in size.  6. Degenerative appearing mitral valve with at least moderate regurgitation (eccentric jet).  7. The right ventricle is normal in size, with normal systolic function.   LHC 12/30/2020  ? Ost LM lesion is 35% stenosed. ? Prox LAD lesion is 90% stenosed. ? Mid LAD lesion is 70% stenosed. ? 1st Diag lesion is 50% stenosed. ? Prox Cx to Mid Cx lesion is 99% stenosed. ? Prox RCA lesion is 70% stenosed. ? LV end diastolic pressure is normal.   1. Severe 3 vessel obstructive CAD 2. Normal LV filling pressures 3. Normal right heart pressures. 4. Normal cardiac output  Plan: recommend referral to CT surgery for CABG. He has good targets. Given LV dysfunction, DM, and multivessel CAD his best option is CABG.    Neuro/Psych negative neurological ROS     GI/Hepatic negative GI ROS, Neg liver ROS,   Endo/Other  negative endocrine ROSdiabetes  Renal/GU Renal disease      Musculoskeletal negative musculoskeletal ROS (+)   Abdominal   Peds  Hematology negative hematology ROS (+)   Anesthesia Other Findings   Reproductive/Obstetrics                            Anesthesia Physical Anesthesia Plan  ASA: 4  Anesthesia Plan: General   Post-op Pain Management:    Induction: Intravenous  PONV Risk Score and Plan: 3 and Treatment may vary due to age or medical condition and Midazolam  Airway Management Planned: Oral ETT  Additional Equipment: Arterial line, CVP, PA Cath, TEE and Ultrasound Guidance Line Placement  Intra-op Plan: Utilization Of Total Body Hypothermia per surgeon request  Post-operative Plan: Post-operative intubation/ventilation  Informed Consent: I have reviewed the patients History and Physical, chart, labs and discussed the procedure including the risks, benefits and alternatives for the proposed anesthesia with the patient or authorized representative who has indicated his/her understanding and acceptance.     Dental advisory given  Plan Discussed with: CRNA  Anesthesia Plan Comments:        Anesthesia Quick Evaluation

## 2021-01-10 ENCOUNTER — Inpatient Hospital Stay (HOSPITAL_COMMUNITY)
Admission: RE | Admit: 2021-01-10 | Discharge: 2021-01-15 | DRG: 220 | Disposition: A | Discharge status: Home / Self Care | Payer: Medicare Other | Attending: Thoracic Surgery (Cardiothoracic Vascular Surgery) | Admitting: Thoracic Surgery (Cardiothoracic Vascular Surgery)

## 2021-01-10 ENCOUNTER — Inpatient Hospital Stay (HOSPITAL_COMMUNITY): Payer: Medicare Other | Admitting: Certified Registered Nurse Anesthetist

## 2021-01-10 ENCOUNTER — Other Ambulatory Visit: Payer: Self-pay

## 2021-01-10 ENCOUNTER — Inpatient Hospital Stay (HOSPITAL_COMMUNITY)
Admission: RE | Disposition: A | Discharge status: Home / Self Care | Payer: Self-pay | Source: Home / Self Care | Attending: Thoracic Surgery (Cardiothoracic Vascular Surgery)

## 2021-01-10 ENCOUNTER — Inpatient Hospital Stay (HOSPITAL_COMMUNITY): Payer: Medicare Other

## 2021-01-10 ENCOUNTER — Inpatient Hospital Stay (HOSPITAL_COMMUNITY): Payer: Medicare Other | Admitting: Vascular Surgery

## 2021-01-10 ENCOUNTER — Encounter (HOSPITAL_COMMUNITY): Payer: Self-pay | Admitting: Thoracic Surgery (Cardiothoracic Vascular Surgery)

## 2021-01-10 DIAGNOSIS — I2510 Atherosclerotic heart disease of native coronary artery without angina pectoris: Secondary | ICD-10-CM

## 2021-01-10 DIAGNOSIS — I342 Nonrheumatic mitral (valve) stenosis: Secondary | ICD-10-CM | POA: Diagnosis not present

## 2021-01-10 DIAGNOSIS — Z951 Presence of aortocoronary bypass graft: Secondary | ICD-10-CM

## 2021-01-10 DIAGNOSIS — Z85828 Personal history of other malignant neoplasm of skin: Secondary | ICD-10-CM | POA: Diagnosis not present

## 2021-01-10 DIAGNOSIS — K449 Diaphragmatic hernia without obstruction or gangrene: Secondary | ICD-10-CM | POA: Diagnosis present

## 2021-01-10 DIAGNOSIS — I110 Hypertensive heart disease with heart failure: Secondary | ICD-10-CM | POA: Diagnosis not present

## 2021-01-10 DIAGNOSIS — I340 Nonrheumatic mitral (valve) insufficiency: Secondary | ICD-10-CM | POA: Diagnosis not present

## 2021-01-10 DIAGNOSIS — Z952 Presence of prosthetic heart valve: Secondary | ICD-10-CM | POA: Diagnosis not present

## 2021-01-10 DIAGNOSIS — I10 Essential (primary) hypertension: Secondary | ICD-10-CM | POA: Diagnosis present

## 2021-01-10 DIAGNOSIS — I5021 Acute systolic (congestive) heart failure: Secondary | ICD-10-CM | POA: Diagnosis not present

## 2021-01-10 DIAGNOSIS — I454 Nonspecific intraventricular block: Secondary | ICD-10-CM | POA: Diagnosis not present

## 2021-01-10 DIAGNOSIS — Z01818 Encounter for other preprocedural examination: Secondary | ICD-10-CM | POA: Diagnosis not present

## 2021-01-10 DIAGNOSIS — Z7984 Long term (current) use of oral hypoglycemic drugs: Secondary | ICD-10-CM

## 2021-01-10 DIAGNOSIS — E785 Hyperlipidemia, unspecified: Secondary | ICD-10-CM | POA: Diagnosis not present

## 2021-01-10 DIAGNOSIS — R531 Weakness: Secondary | ICD-10-CM | POA: Diagnosis not present

## 2021-01-10 DIAGNOSIS — R1110 Vomiting, unspecified: Secondary | ICD-10-CM | POA: Diagnosis not present

## 2021-01-10 DIAGNOSIS — Z79899 Other long term (current) drug therapy: Secondary | ICD-10-CM | POA: Diagnosis not present

## 2021-01-10 DIAGNOSIS — D62 Acute posthemorrhagic anemia: Secondary | ICD-10-CM | POA: Diagnosis not present

## 2021-01-10 DIAGNOSIS — D6959 Other secondary thrombocytopenia: Secondary | ICD-10-CM | POA: Diagnosis not present

## 2021-01-10 DIAGNOSIS — Z9889 Other specified postprocedural states: Secondary | ICD-10-CM

## 2021-01-10 DIAGNOSIS — R000 Tachycardia, unspecified: Secondary | ICD-10-CM | POA: Diagnosis not present

## 2021-01-10 DIAGNOSIS — E11649 Type 2 diabetes mellitus with hypoglycemia without coma: Secondary | ICD-10-CM | POA: Diagnosis not present

## 2021-01-10 DIAGNOSIS — Z4682 Encounter for fitting and adjustment of non-vascular catheter: Secondary | ICD-10-CM | POA: Diagnosis not present

## 2021-01-10 DIAGNOSIS — Z87442 Personal history of urinary calculi: Secondary | ICD-10-CM | POA: Diagnosis not present

## 2021-01-10 DIAGNOSIS — J9382 Other air leak: Secondary | ICD-10-CM | POA: Diagnosis not present

## 2021-01-10 DIAGNOSIS — G540 Brachial plexus disorders: Secondary | ICD-10-CM | POA: Diagnosis not present

## 2021-01-10 DIAGNOSIS — Z8249 Family history of ischemic heart disease and other diseases of the circulatory system: Secondary | ICD-10-CM

## 2021-01-10 DIAGNOSIS — I00 Rheumatic fever without heart involvement: Secondary | ICD-10-CM | POA: Diagnosis not present

## 2021-01-10 DIAGNOSIS — Z825 Family history of asthma and other chronic lower respiratory diseases: Secondary | ICD-10-CM

## 2021-01-10 DIAGNOSIS — Z87891 Personal history of nicotine dependence: Secondary | ICD-10-CM

## 2021-01-10 DIAGNOSIS — J90 Pleural effusion, not elsewhere classified: Secondary | ICD-10-CM | POA: Diagnosis not present

## 2021-01-10 DIAGNOSIS — Z20822 Contact with and (suspected) exposure to covid-19: Secondary | ICD-10-CM | POA: Diagnosis not present

## 2021-01-10 DIAGNOSIS — J9811 Atelectasis: Secondary | ICD-10-CM | POA: Diagnosis not present

## 2021-01-10 DIAGNOSIS — I517 Cardiomegaly: Secondary | ICD-10-CM | POA: Diagnosis not present

## 2021-01-10 DIAGNOSIS — Z419 Encounter for procedure for purposes other than remedying health state, unspecified: Secondary | ICD-10-CM

## 2021-01-10 HISTORY — PX: TEE WITHOUT CARDIOVERSION: SHX5443

## 2021-01-10 HISTORY — PX: MITRAL VALVE REPAIR: SHX2039

## 2021-01-10 HISTORY — PX: CORONARY ARTERY BYPASS GRAFT: SHX141

## 2021-01-10 HISTORY — PX: ENDOVEIN HARVEST OF GREATER SAPHENOUS VEIN: SHX5059

## 2021-01-10 HISTORY — PX: CHEST EXPLORATION: SHX5104

## 2021-01-10 LAB — POCT I-STAT 7, (LYTES, BLD GAS, ICA,H+H)
Acid-Base Excess: 0 mmol/L (ref 0.0–2.0)
Acid-Base Excess: 3 mmol/L — ABNORMAL HIGH (ref 0.0–2.0)
Acid-Base Excess: 3 mmol/L — ABNORMAL HIGH (ref 0.0–2.0)
Acid-Base Excess: 3 mmol/L — ABNORMAL HIGH (ref 0.0–2.0)
Acid-Base Excess: 2 mmol/L (ref 0.0–2.0)
Acid-base deficit: 2 mmol/L (ref 0.0–2.0)
Acid-base deficit: 4 mmol/L — ABNORMAL HIGH (ref 0.0–2.0)
Acid-base deficit: 2 mmol/L (ref 0.0–2.0)
Acid-base deficit: 5 mmol/L — ABNORMAL HIGH (ref 0.0–2.0)
Acid-base deficit: 3 mmol/L — ABNORMAL HIGH (ref 0.0–2.0)
Bicarbonate: 27.4 mmol/L (ref 20.0–28.0)
Bicarbonate: 29.1 mmol/L — ABNORMAL HIGH (ref 20.0–28.0)
Bicarbonate: 27.3 mmol/L (ref 20.0–28.0)
Bicarbonate: 28.9 mmol/L — ABNORMAL HIGH (ref 20.0–28.0)
Bicarbonate: 26.4 mmol/L (ref 20.0–28.0)
Bicarbonate: 24.7 mmol/L (ref 20.0–28.0)
Bicarbonate: 24.2 mmol/L (ref 20.0–28.0)
Bicarbonate: 26 mmol/L (ref 20.0–28.0)
Bicarbonate: 21.5 mmol/L (ref 20.0–28.0)
Bicarbonate: 22.7 mmol/L (ref 20.0–28.0)
Calcium, Ion: 1.26 mmol/L (ref 1.15–1.40)
Calcium, Ion: 0.99 mmol/L — ABNORMAL LOW (ref 1.15–1.40)
Calcium, Ion: 0.98 mmol/L — ABNORMAL LOW (ref 1.15–1.40)
Calcium, Ion: 0.95 mmol/L — ABNORMAL LOW (ref 1.15–1.40)
Calcium, Ion: 0.87 mmol/L — CL (ref 1.15–1.40)
Calcium, Ion: 1.08 mmol/L — ABNORMAL LOW (ref 1.15–1.40)
Calcium, Ion: 1.04 mmol/L — ABNORMAL LOW (ref 1.15–1.40)
Calcium, Ion: 1.01 mmol/L — ABNORMAL LOW (ref 1.15–1.40)
Calcium, Ion: 0.99 mmol/L — ABNORMAL LOW (ref 1.15–1.40)
Calcium, Ion: 0.97 mmol/L — ABNORMAL LOW (ref 1.15–1.40)
HCT: 43 % (ref 39.0–52.0)
HCT: 32 % — ABNORMAL LOW (ref 39.0–52.0)
HCT: 30 % — ABNORMAL LOW (ref 39.0–52.0)
HCT: 32 % — ABNORMAL LOW (ref 39.0–52.0)
HCT: 29 % — ABNORMAL LOW (ref 39.0–52.0)
HCT: 29 % — ABNORMAL LOW (ref 39.0–52.0)
HCT: 29 % — ABNORMAL LOW (ref 39.0–52.0)
HCT: 24 % — ABNORMAL LOW (ref 39.0–52.0)
HCT: 23 % — ABNORMAL LOW (ref 39.0–52.0)
HCT: 23 % — ABNORMAL LOW (ref 39.0–52.0)
Hemoglobin: 14.6 g/dL (ref 13.0–17.0)
Hemoglobin: 10.9 g/dL — ABNORMAL LOW (ref 13.0–17.0)
Hemoglobin: 10.2 g/dL — ABNORMAL LOW (ref 13.0–17.0)
Hemoglobin: 10.9 g/dL — ABNORMAL LOW (ref 13.0–17.0)
Hemoglobin: 9.9 g/dL — ABNORMAL LOW (ref 13.0–17.0)
Hemoglobin: 9.9 g/dL — ABNORMAL LOW (ref 13.0–17.0)
Hemoglobin: 9.9 g/dL — ABNORMAL LOW (ref 13.0–17.0)
Hemoglobin: 8.2 g/dL — ABNORMAL LOW (ref 13.0–17.0)
Hemoglobin: 7.8 g/dL — ABNORMAL LOW (ref 13.0–17.0)
Hemoglobin: 7.8 g/dL — ABNORMAL LOW (ref 13.0–17.0)
O2 Saturation: 100 %
O2 Saturation: 100 %
O2 Saturation: 100 %
O2 Saturation: 100 %
O2 Saturation: 100 %
O2 Saturation: 100 %
O2 Saturation: 94 %
O2 Saturation: 97 %
O2 Saturation: 99 %
O2 Saturation: 98 %
Patient temperature: 35.9
Patient temperature: 35.9
Patient temperature: 36.4
Patient temperature: 36.8
Potassium: 4.2 mmol/L (ref 3.5–5.1)
Potassium: 4.3 mmol/L (ref 3.5–5.1)
Potassium: 4.7 mmol/L (ref 3.5–5.1)
Potassium: 4.9 mmol/L (ref 3.5–5.1)
Potassium: 4.7 mmol/L (ref 3.5–5.1)
Potassium: 4.2 mmol/L (ref 3.5–5.1)
Potassium: 4 mmol/L (ref 3.5–5.1)
Potassium: 3.9 mmol/L (ref 3.5–5.1)
Potassium: 3.8 mmol/L (ref 3.5–5.1)
Potassium: 3.7 mmol/L (ref 3.5–5.1)
Sodium: 141 mmol/L (ref 135–145)
Sodium: 141 mmol/L (ref 135–145)
Sodium: 140 mmol/L (ref 135–145)
Sodium: 140 mmol/L (ref 135–145)
Sodium: 142 mmol/L (ref 135–145)
Sodium: 144 mmol/L (ref 135–145)
Sodium: 146 mmol/L — ABNORMAL HIGH (ref 135–145)
Sodium: 146 mmol/L — ABNORMAL HIGH (ref 135–145)
Sodium: 145 mmol/L (ref 135–145)
Sodium: 147 mmol/L — ABNORMAL HIGH (ref 135–145)
TCO2: 29 mmol/L (ref 22–32)
TCO2: 31 mmol/L (ref 22–32)
TCO2: 29 mmol/L (ref 22–32)
TCO2: 30 mmol/L (ref 22–32)
TCO2: 28 mmol/L (ref 22–32)
TCO2: 26 mmol/L (ref 22–32)
TCO2: 26 mmol/L (ref 22–32)
TCO2: 28 mmol/L (ref 22–32)
TCO2: 23 mmol/L (ref 22–32)
TCO2: 24 mmol/L (ref 22–32)
pCO2 arterial: 55.6 mmHg — ABNORMAL HIGH (ref 32.0–48.0)
pCO2 arterial: 50.3 mmHg — ABNORMAL HIGH (ref 32.0–48.0)
pCO2 arterial: 42 mmHg (ref 32.0–48.0)
pCO2 arterial: 49.9 mmHg — ABNORMAL HIGH (ref 32.0–48.0)
pCO2 arterial: 37.7 mmHg (ref 32.0–48.0)
pCO2 arterial: 49.4 mmHg — ABNORMAL HIGH (ref 32.0–48.0)
pCO2 arterial: 56.4 mmHg — ABNORMAL HIGH (ref 32.0–48.0)
pCO2 arterial: 61.9 mmHg — ABNORMAL HIGH (ref 32.0–48.0)
pCO2 arterial: 45.3 mmHg (ref 32.0–48.0)
pCO2 arterial: 42.3 mmHg (ref 32.0–48.0)
pH, Arterial: 7.301 — ABNORMAL LOW (ref 7.350–7.450)
pH, Arterial: 7.371 (ref 7.350–7.450)
pH, Arterial: 7.421 (ref 7.350–7.450)
pH, Arterial: 7.37 (ref 7.350–7.450)
pH, Arterial: 7.454 — ABNORMAL HIGH (ref 7.350–7.450)
pH, Arterial: 7.308 — ABNORMAL LOW (ref 7.350–7.450)
pH, Arterial: 7.235 — ABNORMAL LOW (ref 7.350–7.450)
pH, Arterial: 7.226 — ABNORMAL LOW (ref 7.350–7.450)
pH, Arterial: 7.28 — ABNORMAL LOW (ref 7.350–7.450)
pH, Arterial: 7.337 — ABNORMAL LOW (ref 7.350–7.450)
pO2, Arterial: 363 mmHg — ABNORMAL HIGH (ref 83.0–108.0)
pO2, Arterial: 391 mmHg — ABNORMAL HIGH (ref 83.0–108.0)
pO2, Arterial: 370 mmHg — ABNORMAL HIGH (ref 83.0–108.0)
pO2, Arterial: 396 mmHg — ABNORMAL HIGH (ref 83.0–108.0)
pO2, Arterial: 397 mmHg — ABNORMAL HIGH (ref 83.0–108.0)
pO2, Arterial: 210 mmHg — ABNORMAL HIGH (ref 83.0–108.0)
pO2, Arterial: 80 mmHg — ABNORMAL LOW (ref 83.0–108.0)
pO2, Arterial: 101 mmHg (ref 83.0–108.0)
pO2, Arterial: 144 mmHg — ABNORMAL HIGH (ref 83.0–108.0)
pO2, Arterial: 111 mmHg — ABNORMAL HIGH (ref 83.0–108.0)

## 2021-01-10 LAB — CBC
HCT: 29.4 % — ABNORMAL LOW (ref 39.0–52.0)
HCT: 25.4 % — ABNORMAL LOW (ref 39.0–52.0)
Hemoglobin: 9.9 g/dL — ABNORMAL LOW (ref 13.0–17.0)
Hemoglobin: 8.6 g/dL — ABNORMAL LOW (ref 13.0–17.0)
MCH: 32 pg (ref 26.0–34.0)
MCH: 32 pg (ref 26.0–34.0)
MCHC: 33.7 g/dL (ref 30.0–36.0)
MCHC: 33.9 g/dL (ref 30.0–36.0)
MCV: 95.1 fL (ref 80.0–100.0)
MCV: 94.4 fL (ref 80.0–100.0)
Platelets: 112 10*3/uL — ABNORMAL LOW (ref 150–400)
Platelets: 98 10*3/uL — ABNORMAL LOW (ref 150–400)
RBC: 3.09 MIL/uL — ABNORMAL LOW (ref 4.22–5.81)
RBC: 2.69 MIL/uL — ABNORMAL LOW (ref 4.22–5.81)
RDW: 13 % (ref 11.5–15.5)
RDW: 13.2 % (ref 11.5–15.5)
WBC: 13.4 10*3/uL — ABNORMAL HIGH (ref 4.0–10.5)
WBC: 8.9 10*3/uL (ref 4.0–10.5)
nRBC: 0 % (ref 0.0–0.2)
nRBC: 0 % (ref 0.0–0.2)

## 2021-01-10 LAB — BASIC METABOLIC PANEL
Anion gap: 9 (ref 5–15)
BUN: 9 mg/dL (ref 8–23)
CO2: 24 mmol/L (ref 22–32)
Calcium: 6.6 mg/dL — ABNORMAL LOW (ref 8.9–10.3)
Chloride: 108 mmol/L (ref 98–111)
Creatinine, Ser: 0.88 mg/dL (ref 0.61–1.24)
GFR, Estimated: >60 mL/min (ref >60)
Glucose, Bld: 127 mg/dL — ABNORMAL HIGH (ref 70–99)
Potassium: 3.5 mmol/L (ref 3.5–5.1)
Sodium: 141 mmol/L (ref 135–145)

## 2021-01-10 LAB — POCT I-STAT, CHEM 8
BUN: 14 mg/dL (ref 8–23)
BUN: 13 mg/dL (ref 8–23)
BUN: 14 mg/dL (ref 8–23)
BUN: 11 mg/dL (ref 8–23)
BUN: 11 mg/dL (ref 8–23)
BUN: 10 mg/dL (ref 8–23)
BUN: 10 mg/dL (ref 8–23)
Calcium, Ion: 1.28 mmol/L (ref 1.15–1.40)
Calcium, Ion: 1.23 mmol/L (ref 1.15–1.40)
Calcium, Ion: 1.09 mmol/L — ABNORMAL LOW (ref 1.15–1.40)
Calcium, Ion: 0.96 mmol/L — ABNORMAL LOW (ref 1.15–1.40)
Calcium, Ion: 0.91 mmol/L — ABNORMAL LOW (ref 1.15–1.40)
Calcium, Ion: 0.88 mmol/L — CL (ref 1.15–1.40)
Calcium, Ion: 1.1 mmol/L — ABNORMAL LOW (ref 1.15–1.40)
Chloride: 103 mmol/L (ref 98–111)
Chloride: 103 mmol/L (ref 98–111)
Chloride: 101 mmol/L (ref 98–111)
Chloride: 103 mmol/L (ref 98–111)
Chloride: 103 mmol/L (ref 98–111)
Chloride: 105 mmol/L (ref 98–111)
Chloride: 106 mmol/L (ref 98–111)
Creatinine, Ser: 0.7 mg/dL (ref 0.61–1.24)
Creatinine, Ser: 0.6 mg/dL — ABNORMAL LOW (ref 0.61–1.24)
Creatinine, Ser: 0.5 mg/dL — ABNORMAL LOW (ref 0.61–1.24)
Creatinine, Ser: 0.5 mg/dL — ABNORMAL LOW (ref 0.61–1.24)
Creatinine, Ser: 0.4 mg/dL — ABNORMAL LOW (ref 0.61–1.24)
Creatinine, Ser: 0.5 mg/dL — ABNORMAL LOW (ref 0.61–1.24)
Creatinine, Ser: 0.5 mg/dL — ABNORMAL LOW (ref 0.61–1.24)
Glucose, Bld: 177 mg/dL — ABNORMAL HIGH (ref 70–99)
Glucose, Bld: 167 mg/dL — ABNORMAL HIGH (ref 70–99)
Glucose, Bld: 124 mg/dL — ABNORMAL HIGH (ref 70–99)
Glucose, Bld: 104 mg/dL — ABNORMAL HIGH (ref 70–99)
Glucose, Bld: 127 mg/dL — ABNORMAL HIGH (ref 70–99)
Glucose, Bld: 113 mg/dL — ABNORMAL HIGH (ref 70–99)
Glucose, Bld: 141 mg/dL — ABNORMAL HIGH (ref 70–99)
HCT: 40 % (ref 39.0–52.0)
HCT: 39 % (ref 39.0–52.0)
HCT: 32 % — ABNORMAL LOW (ref 39.0–52.0)
HCT: 29 % — ABNORMAL LOW (ref 39.0–52.0)
HCT: 30 % — ABNORMAL LOW (ref 39.0–52.0)
HCT: 25 % — ABNORMAL LOW (ref 39.0–52.0)
HCT: 26 % — ABNORMAL LOW (ref 39.0–52.0)
Hemoglobin: 13.6 g/dL (ref 13.0–17.0)
Hemoglobin: 13.3 g/dL (ref 13.0–17.0)
Hemoglobin: 10.9 g/dL — ABNORMAL LOW (ref 13.0–17.0)
Hemoglobin: 9.9 g/dL — ABNORMAL LOW (ref 13.0–17.0)
Hemoglobin: 10.2 g/dL — ABNORMAL LOW (ref 13.0–17.0)
Hemoglobin: 8.5 g/dL — ABNORMAL LOW (ref 13.0–17.0)
Hemoglobin: 8.8 g/dL — ABNORMAL LOW (ref 13.0–17.0)
Potassium: 4.2 mmol/L (ref 3.5–5.1)
Potassium: 4.3 mmol/L (ref 3.5–5.1)
Potassium: 3.9 mmol/L (ref 3.5–5.1)
Potassium: 4.6 mmol/L (ref 3.5–5.1)
Potassium: 5 mmol/L (ref 3.5–5.1)
Potassium: 4.2 mmol/L (ref 3.5–5.1)
Potassium: 5.2 mmol/L — ABNORMAL HIGH (ref 3.5–5.1)
Sodium: 141 mmol/L (ref 135–145)
Sodium: 139 mmol/L (ref 135–145)
Sodium: 139 mmol/L (ref 135–145)
Sodium: 139 mmol/L (ref 135–145)
Sodium: 139 mmol/L (ref 135–145)
Sodium: 141 mmol/L (ref 135–145)
Sodium: 142 mmol/L (ref 135–145)
TCO2: 29 mmol/L (ref 22–32)
TCO2: 26 mmol/L (ref 22–32)
TCO2: 29 mmol/L (ref 22–32)
TCO2: 28 mmol/L (ref 22–32)
TCO2: 28 mmol/L (ref 22–32)
TCO2: 26 mmol/L (ref 22–32)
TCO2: 26 mmol/L (ref 22–32)

## 2021-01-10 LAB — POCT I-STAT EG7
Acid-Base Excess: 1 mmol/L (ref 0.0–2.0)
Bicarbonate: 27 mmol/L (ref 20.0–28.0)
Calcium, Ion: 1.08 mmol/L — ABNORMAL LOW (ref 1.15–1.40)
HCT: 32 % — ABNORMAL LOW (ref 39.0–52.0)
Hemoglobin: 10.9 g/dL — ABNORMAL LOW (ref 13.0–17.0)
O2 Saturation: 89 %
Potassium: 3.7 mmol/L (ref 3.5–5.1)
Sodium: 143 mmol/L (ref 135–145)
TCO2: 28 mmol/L (ref 22–32)
pCO2, Ven: 49.7 mmHg (ref 44.0–60.0)
pH, Ven: 7.342 (ref 7.250–7.430)
pO2, Ven: 61 mmHg — ABNORMAL HIGH (ref 32.0–45.0)

## 2021-01-10 LAB — ECHO INTRAOPERATIVE TEE
AV Mean grad: 4 mmHg
AV Peak grad: 6.3 mmHg
Ao pk vel: 1.25 m/s
Height: 69 in
MV M vel: 4.96 m/s
MV Peak grad: 98.4 mmHg
Radius: 1 cm
Weight: 3200 oz

## 2021-01-10 LAB — GLUCOSE, CAPILLARY
Glucose-Capillary: 185 mg/dL — ABNORMAL HIGH (ref 70–99)
Glucose-Capillary: 104 mg/dL — ABNORMAL HIGH (ref 70–99)
Glucose-Capillary: 117 mg/dL — ABNORMAL HIGH (ref 70–99)
Glucose-Capillary: 133 mg/dL — ABNORMAL HIGH (ref 70–99)
Glucose-Capillary: 158 mg/dL — ABNORMAL HIGH (ref 70–99)
Glucose-Capillary: 135 mg/dL — ABNORMAL HIGH (ref 70–99)
Glucose-Capillary: 148 mg/dL — ABNORMAL HIGH (ref 70–99)
Glucose-Capillary: 137 mg/dL — ABNORMAL HIGH (ref 70–99)

## 2021-01-10 LAB — APTT: aPTT: 33 seconds (ref 24–36)

## 2021-01-10 LAB — HEMOGLOBIN AND HEMATOCRIT, BLOOD
HCT: 29.9 % — ABNORMAL LOW (ref 39.0–52.0)
Hemoglobin: 10.5 g/dL — ABNORMAL LOW (ref 13.0–17.0)

## 2021-01-10 LAB — HEMOGLOBIN A1C
Hgb A1c MFr Bld: 8.5 % — ABNORMAL HIGH (ref 4.8–5.6)
Mean Plasma Glucose: 197 mg/dL

## 2021-01-10 LAB — MAGNESIUM: Magnesium: 2.8 mg/dL — ABNORMAL HIGH (ref 1.7–2.4)

## 2021-01-10 LAB — ABO/RH: ABO/RH(D): A POS

## 2021-01-10 LAB — FIBRINOGEN: Fibrinogen: 190 mg/dL — ABNORMAL LOW (ref 210–475)

## 2021-01-10 LAB — PLATELET COUNT: Platelets: 102 10*3/uL — ABNORMAL LOW (ref 150–400)

## 2021-01-10 LAB — PROTIME-INR
INR: 1.5 — ABNORMAL HIGH (ref 0.8–1.2)
Prothrombin Time: 18.1 seconds — ABNORMAL HIGH (ref 11.4–15.2)

## 2021-01-10 SURGERY — CORONARY ARTERY BYPASS GRAFTING (CABG)
Anesthesia: General | Site: Chest | Laterality: Right

## 2021-01-10 MED ORDER — PANTOPRAZOLE SODIUM 40 MG PO TBEC
40.0000 mg | DELAYED_RELEASE_TABLET | Freq: Every day | ORAL | Status: DC
  Administered 2021-01-12 – 2021-01-14 (×3): 40 mg via ORAL
  Filled 2021-01-10 (×4): qty 1

## 2021-01-10 MED ORDER — PROPOFOL 10 MG/ML IV BOLUS
INTRAVENOUS | Status: AC
  Filled 2021-01-10: qty 20

## 2021-01-10 MED ORDER — PHENYLEPHRINE HCL (PRESSORS) 10 MG/ML IV SOLN
INTRAVENOUS | Status: DC | PRN
  Administered 2021-01-10: 40 ug via INTRAVENOUS
  Administered 2021-01-10 (×2): 80 ug via INTRAVENOUS
  Administered 2021-01-10: 120 ug via INTRAVENOUS
  Administered 2021-01-10: 80 ug via INTRAVENOUS
  Administered 2021-01-10: 120 ug via INTRAVENOUS
  Administered 2021-01-10 (×2): 80 ug via INTRAVENOUS

## 2021-01-10 MED ORDER — TRAMADOL HCL 50 MG PO TABS
50.0000 mg | ORAL_TABLET | ORAL | Status: DC | PRN
  Administered 2021-01-11: 50 mg via ORAL
  Administered 2021-01-11 (×2): 100 mg via ORAL
  Administered 2021-01-12: 50 mg via ORAL
  Administered 2021-01-12: 100 mg via ORAL
  Filled 2021-01-10: qty 2
  Filled 2021-01-10 (×2): qty 1
  Filled 2021-01-10 (×2): qty 2

## 2021-01-10 MED ORDER — OXYCODONE HCL 5 MG PO TABS
5.0000 mg | ORAL_TABLET | ORAL | Status: DC | PRN
  Administered 2021-01-11: 10 mg via ORAL
  Administered 2021-01-11 (×2): 5 mg via ORAL
  Administered 2021-01-11 – 2021-01-12 (×3): 10 mg via ORAL
  Administered 2021-01-13: 5 mg via ORAL
  Filled 2021-01-10 (×3): qty 2
  Filled 2021-01-10: qty 1
  Filled 2021-01-10: qty 2
  Filled 2021-01-10 (×2): qty 1

## 2021-01-10 MED ORDER — MORPHINE SULFATE (PF) 2 MG/ML IV SOLN
1.0000 mg | INTRAVENOUS | Status: DC | PRN
  Administered 2021-01-10 – 2021-01-11 (×2): 2 mg via INTRAVENOUS
  Administered 2021-01-11: 4 mg via INTRAVENOUS
  Administered 2021-01-11 – 2021-01-12 (×3): 2 mg via INTRAVENOUS
  Filled 2021-01-10 (×3): qty 1
  Filled 2021-01-10: qty 2
  Filled 2021-01-10 (×3): qty 1

## 2021-01-10 MED ORDER — ALBUMIN HUMAN 5 % IV SOLN: INTRAVENOUS | Status: DC | PRN

## 2021-01-10 MED ORDER — EPHEDRINE SULFATE-NACL 50-0.9 MG/10ML-% IV SOSY
PREFILLED_SYRINGE | INTRAVENOUS | Status: DC | PRN
  Administered 2021-01-10: 5 mg via INTRAVENOUS

## 2021-01-10 MED ORDER — PROTAMINE SULFATE 10 MG/ML IV SOLN
INTRAVENOUS | Status: DC | PRN
  Administered 2021-01-10: 320 mg via INTRAVENOUS

## 2021-01-10 MED ORDER — NITROGLYCERIN IN D5W 200-5 MCG/ML-% IV SOLN: 0.0000 ug/min | INTRAVENOUS | Status: DC

## 2021-01-10 MED ORDER — VANCOMYCIN HCL IN DEXTROSE 1-5 GM/200ML-% IV SOLN
1000.0000 mg | Freq: Once | INTRAVENOUS | Status: AC
  Administered 2021-01-10: 1000 mg via INTRAVENOUS
  Filled 2021-01-10: qty 200

## 2021-01-10 MED ORDER — DOCUSATE SODIUM 100 MG PO CAPS
200.0000 mg | ORAL_CAPSULE | Freq: Every day | ORAL | Status: DC
  Administered 2021-01-11 – 2021-01-13 (×3): 200 mg via ORAL
  Filled 2021-01-10 (×5): qty 2

## 2021-01-10 MED ORDER — ORAL CARE MOUTH RINSE
15.0000 mL | OROMUCOSAL | Status: DC
  Administered 2021-01-10: 15 mL via OROMUCOSAL

## 2021-01-10 MED ORDER — SALINE SPRAY 0.65 % NA SOLN
1.0000 | Freq: Every day | NASAL | Status: DC
  Filled 2021-01-10: qty 44

## 2021-01-10 MED ORDER — SODIUM CHLORIDE 0.9 % IV SOLN: INTRAVENOUS | Status: DC

## 2021-01-10 MED ORDER — ALBUTEROL SULFATE (2.5 MG/3ML) 0.083% IN NEBU
3.0000 mL | INHALATION_SOLUTION | RESPIRATORY_TRACT | Status: DC | PRN
  Administered 2021-01-10: 3 mL via RESPIRATORY_TRACT
  Filled 2021-01-10: qty 3

## 2021-01-10 MED ORDER — ACETAMINOPHEN 160 MG/5ML PO SOLN: 650.0000 mg | Freq: Once | ORAL | Status: AC

## 2021-01-10 MED ORDER — POTASSIUM CHLORIDE 10 MEQ/50ML IV SOLN
10.0000 meq | INTRAVENOUS | Status: AC
Start: 2021-01-10 — End: 2021-01-10

## 2021-01-10 MED ORDER — LORAZEPAM 0.5 MG PO TABS
0.5000 mg | ORAL_TABLET | Freq: Every day | ORAL | Status: DC
  Administered 2021-01-11 – 2021-01-12 (×2): 0.5 mg via ORAL
  Filled 2021-01-10 (×2): qty 1

## 2021-01-10 MED ORDER — SODIUM CHLORIDE 0.45 % IV SOLN: INTRAVENOUS | Status: DC | PRN

## 2021-01-10 MED ORDER — ACETAMINOPHEN 650 MG RE SUPP
650.0000 mg | Freq: Once | RECTAL | Status: AC
  Administered 2021-01-10: 650 mg via RECTAL

## 2021-01-10 MED ORDER — SODIUM CHLORIDE 0.9% FLUSH: 3.0000 mL | INTRAVENOUS | Status: DC | PRN

## 2021-01-10 MED ORDER — INSULIN REGULAR(HUMAN) IN NACL 100-0.9 UT/100ML-% IV SOLN: INTRAVENOUS | Status: DC

## 2021-01-10 MED ORDER — LACTATED RINGERS IV SOLN: INTRAVENOUS | Status: DC

## 2021-01-10 MED ORDER — METOPROLOL TARTRATE 25 MG/10 ML ORAL SUSPENSION: 12.5000 mg | Freq: Two times a day (BID) | ORAL | Status: DC

## 2021-01-10 MED ORDER — EPHEDRINE 5 MG/ML INJ
INTRAVENOUS | Status: AC
  Filled 2021-01-10: qty 10

## 2021-01-10 MED ORDER — ALBUMIN HUMAN 5 % IV SOLN
250.0000 mL | INTRAVENOUS | Status: AC | PRN
  Administered 2021-01-10 (×4): 12.5 g via INTRAVENOUS
  Filled 2021-01-10 (×2): qty 250

## 2021-01-10 MED ORDER — POTASSIUM CHLORIDE 10 MEQ/50ML IV SOLN
10.0000 meq | INTRAVENOUS | Status: AC
  Administered 2021-01-10 – 2021-01-11 (×3): 10 meq via INTRAVENOUS

## 2021-01-10 MED ORDER — MIDAZOLAM HCL 2 MG/2ML IJ SOLN: 2.0000 mg | INTRAMUSCULAR | Status: DC | PRN

## 2021-01-10 MED ORDER — ORAL CARE MOUTH RINSE: 15.0000 mL | Freq: Once | OROMUCOSAL | Status: AC

## 2021-01-10 MED ORDER — SODIUM CHLORIDE (PF) 0.9 % IJ SOLN
OROMUCOSAL | Status: DC | PRN
  Administered 2021-01-10 (×3): 4 mL via TOPICAL

## 2021-01-10 MED ORDER — PHENYLEPHRINE HCL (PRESSORS) 10 MG/ML IV SOLN
INTRAVENOUS | Status: AC
  Filled 2021-01-10: qty 1

## 2021-01-10 MED ORDER — SODIUM BICARBONATE 8.4 % IV SOLN
100.0000 meq | Freq: Once | INTRAVENOUS | Status: AC
  Administered 2021-01-10: 100 meq via INTRAVENOUS
  Filled 2021-01-10: qty 100

## 2021-01-10 MED ORDER — BISACODYL 5 MG PO TBEC
10.0000 mg | DELAYED_RELEASE_TABLET | Freq: Every day | ORAL | Status: DC
  Administered 2021-01-11 – 2021-01-13 (×3): 10 mg via ORAL
  Filled 2021-01-10 (×5): qty 2

## 2021-01-10 MED ORDER — SODIUM CHLORIDE 0.9% FLUSH
10.0000 mL | Freq: Two times a day (BID) | INTRAVENOUS | Status: DC
Start: 2021-01-10 — End: 2021-01-12
  Administered 2021-01-10 – 2021-01-11 (×2): 10 mL

## 2021-01-10 MED ORDER — SODIUM CHLORIDE 0.9 % IV SOLN: 250.0000 mL | INTRAVENOUS | Status: DC

## 2021-01-10 MED ORDER — DEXMEDETOMIDINE HCL IN NACL 400 MCG/100ML IV SOLN
0.0000 ug/kg/h | INTRAVENOUS | Status: DC
Start: 2021-01-10 — End: 2021-01-11
  Administered 2021-01-10: 0.7 ug/kg/h via INTRAVENOUS
  Filled 2021-01-10: qty 100

## 2021-01-10 MED ORDER — PROPOFOL 10 MG/ML IV BOLUS
INTRAVENOUS | Status: DC | PRN
  Administered 2021-01-10: 50 mg via INTRAVENOUS

## 2021-01-10 MED ORDER — ROCURONIUM BROMIDE 10 MG/ML (PF) SYRINGE
PREFILLED_SYRINGE | INTRAVENOUS | Status: AC
  Filled 2021-01-10: qty 10

## 2021-01-10 MED ORDER — ASPIRIN EC 325 MG PO TBEC
325.0000 mg | DELAYED_RELEASE_TABLET | Freq: Every day | ORAL | Status: DC
  Administered 2021-01-11: 325 mg via ORAL
  Filled 2021-01-10: qty 1

## 2021-01-10 MED ORDER — FENTANYL CITRATE (PF) 250 MCG/5ML IJ SOLN
INTRAMUSCULAR | Status: DC | PRN
  Administered 2021-01-10: 100 ug via INTRAVENOUS
  Administered 2021-01-10: 50 ug via INTRAVENOUS
  Administered 2021-01-10: 150 ug via INTRAVENOUS
  Administered 2021-01-10 (×3): 100 ug via INTRAVENOUS
  Administered 2021-01-10: 350 ug via INTRAVENOUS
  Administered 2021-01-10: 100 ug via INTRAVENOUS
  Administered 2021-01-10: 50 ug via INTRAVENOUS
  Administered 2021-01-10: 250 ug via INTRAVENOUS
  Administered 2021-01-10: 150 ug via INTRAVENOUS

## 2021-01-10 MED ORDER — METOPROLOL TARTRATE 5 MG/5ML IV SOLN: 2.5000 mg | INTRAVENOUS | Status: DC | PRN

## 2021-01-10 MED ORDER — CHLORHEXIDINE GLUCONATE 0.12% ORAL RINSE (MEDLINE KIT)
15.0000 mL | Freq: Two times a day (BID) | OROMUCOSAL | Status: DC
  Administered 2021-01-10: 15 mL via OROMUCOSAL

## 2021-01-10 MED ORDER — ACETAMINOPHEN 160 MG/5ML PO SOLN: 1000.0000 mg | Freq: Four times a day (QID) | ORAL | Status: DC

## 2021-01-10 MED ORDER — METOPROLOL TARTRATE 12.5 MG HALF TABLET
12.5000 mg | ORAL_TABLET | Freq: Two times a day (BID) | ORAL | Status: DC
  Administered 2021-01-11: 12.5 mg via ORAL
  Filled 2021-01-10: qty 1

## 2021-01-10 MED ORDER — EPINEPHRINE HCL 5 MG/250ML IV SOLN IN NS
0.0000 ug/min | INTRAVENOUS | Status: DC
  Filled 2021-01-10: qty 250

## 2021-01-10 MED ORDER — MILRINONE LACTATE IN DEXTROSE 20-5 MG/100ML-% IV SOLN
0.1250 ug/kg/min | INTRAVENOUS | Status: DC
  Administered 2021-01-11: 0.125 ug/kg/min via INTRAVENOUS
  Filled 2021-01-10: qty 100

## 2021-01-10 MED ORDER — ASPIRIN 81 MG PO CHEW: 324.0000 mg | CHEWABLE_TABLET | Freq: Every day | ORAL | Status: DC

## 2021-01-10 MED ORDER — FAMOTIDINE IN NACL 20-0.9 MG/50ML-% IV SOLN
20.0000 mg | Freq: Two times a day (BID) | INTRAVENOUS | Status: AC
  Administered 2021-01-10 – 2021-01-11 (×2): 20 mg via INTRAVENOUS
  Filled 2021-01-10 (×2): qty 50

## 2021-01-10 MED ORDER — SODIUM CHLORIDE 0.9% FLUSH: 10.0000 mL | INTRAVENOUS | Status: DC | PRN

## 2021-01-10 MED ORDER — MAGNESIUM SULFATE 4 GM/100ML IV SOLN
4.0000 g | Freq: Once | INTRAVENOUS | Status: AC
  Administered 2021-01-10: 4 g via INTRAVENOUS
  Filled 2021-01-10: qty 100

## 2021-01-10 MED ORDER — CHLORHEXIDINE GLUCONATE 0.12 % MT SOLN: 15.0000 mL | Freq: Once | OROMUCOSAL | Status: DC

## 2021-01-10 MED ORDER — LACTATED RINGERS IV SOLN
500.0000 mL | Freq: Once | INTRAVENOUS | Status: AC | PRN
  Administered 2021-01-10: 500 mL via INTRAVENOUS

## 2021-01-10 MED ORDER — SODIUM CHLORIDE 0.9 % IV SOLN: INTRAVENOUS | Status: DC | PRN

## 2021-01-10 MED ORDER — BISACODYL 10 MG RE SUPP
10.0000 mg | Freq: Every day | RECTAL | Status: DC
  Filled 2021-01-10: qty 1

## 2021-01-10 MED ORDER — ARTIFICIAL TEARS OPHTHALMIC OINT
TOPICAL_OINTMENT | OPHTHALMIC | Status: AC
  Filled 2021-01-10: qty 3.5

## 2021-01-10 MED ORDER — CHLORHEXIDINE GLUCONATE CLOTH 2 % EX PADS
6.0000 | MEDICATED_PAD | Freq: Every day | CUTANEOUS | Status: DC
  Administered 2021-01-10 – 2021-01-14 (×5): 6 via TOPICAL

## 2021-01-10 MED ORDER — ACETAMINOPHEN 500 MG PO TABS
1000.0000 mg | ORAL_TABLET | Freq: Four times a day (QID) | ORAL | Status: DC
  Administered 2021-01-11 – 2021-01-15 (×15): 1000 mg via ORAL
  Filled 2021-01-10 (×16): qty 2

## 2021-01-10 MED ORDER — METOPROLOL TARTRATE 12.5 MG HALF TABLET
12.5000 mg | ORAL_TABLET | Freq: Once | ORAL | Status: AC
  Administered 2021-01-10: 12.5 mg via ORAL
  Filled 2021-01-10: qty 1

## 2021-01-10 MED ORDER — ROSUVASTATIN CALCIUM 20 MG PO TABS
20.0000 mg | ORAL_TABLET | Freq: Every day | ORAL | Status: DC
  Administered 2021-01-11 – 2021-01-14 (×4): 20 mg via ORAL
  Filled 2021-01-10 (×5): qty 1

## 2021-01-10 MED ORDER — SODIUM BICARBONATE 8.4 % IV SOLN
50.0000 meq | Freq: Once | INTRAVENOUS | Status: AC
  Administered 2021-01-10: 50 meq via INTRAVENOUS

## 2021-01-10 MED ORDER — HEPARIN SODIUM (PORCINE) 1000 UNIT/ML IJ SOLN
INTRAMUSCULAR | Status: DC | PRN
  Administered 2021-01-10: 30000 [IU] via INTRAVENOUS
  Administered 2021-01-10: 2000 [IU] via INTRAVENOUS

## 2021-01-10 MED ORDER — 0.9 % SODIUM CHLORIDE (POUR BTL) OPTIME
TOPICAL | Status: DC | PRN
  Administered 2021-01-10: 3000 mL

## 2021-01-10 MED ORDER — PLASMA-LYTE A IV SOLN
INTRAVENOUS | Status: DC | PRN
  Administered 2021-01-10: 1000 mL

## 2021-01-10 MED ORDER — PHENYLEPHRINE 40 MCG/ML (10ML) SYRINGE FOR IV PUSH (FOR BLOOD PRESSURE SUPPORT)
PREFILLED_SYRINGE | INTRAVENOUS | Status: AC
  Filled 2021-01-10: qty 10

## 2021-01-10 MED ORDER — CEFAZOLIN SODIUM-DEXTROSE 2-4 GM/100ML-% IV SOLN
2.0000 g | Freq: Three times a day (TID) | INTRAVENOUS | Status: AC
  Administered 2021-01-10 – 2021-01-12 (×6): 2 g via INTRAVENOUS
  Filled 2021-01-10 (×6): qty 100

## 2021-01-10 MED ORDER — SODIUM CHLORIDE 0.9% FLUSH
3.0000 mL | Freq: Two times a day (BID) | INTRAVENOUS | Status: DC
  Administered 2021-01-11 (×2): 3 mL via INTRAVENOUS

## 2021-01-10 MED ORDER — CHLORHEXIDINE GLUCONATE 4 % EX LIQD: 30.0000 mL | CUTANEOUS | Status: DC

## 2021-01-10 MED ORDER — ~~LOC~~ CARDIAC SURGERY, PATIENT & FAMILY EDUCATION
Freq: Once | Status: DC
  Filled 2021-01-10: qty 1

## 2021-01-10 MED ORDER — HEMOSTATIC AGENTS (NO CHARGE) OPTIME
TOPICAL | Status: DC | PRN
  Administered 2021-01-10: 1 via TOPICAL

## 2021-01-10 MED ORDER — ROCURONIUM BROMIDE 10 MG/ML (PF) SYRINGE
PREFILLED_SYRINGE | INTRAVENOUS | Status: DC | PRN
  Administered 2021-01-10 (×3): 100 mg via INTRAVENOUS

## 2021-01-10 MED ORDER — ORAL CARE MOUTH RINSE
15.0000 mL | Freq: Two times a day (BID) | OROMUCOSAL | Status: DC
  Administered 2021-01-10 – 2021-01-14 (×8): 15 mL via OROMUCOSAL

## 2021-01-10 MED ORDER — HEPARIN SODIUM (PORCINE) 1000 UNIT/ML IJ SOLN
INTRAMUSCULAR | Status: AC
  Filled 2021-01-10: qty 1

## 2021-01-10 MED ORDER — CHLORHEXIDINE GLUCONATE 0.12 % MT SOLN
15.0000 mL | Freq: Once | OROMUCOSAL | Status: AC
  Administered 2021-01-10: 15 mL via OROMUCOSAL
  Filled 2021-01-10: qty 15

## 2021-01-10 MED ORDER — CHLORHEXIDINE GLUCONATE 0.12 % MT SOLN
15.0000 mL | OROMUCOSAL | Status: AC
  Administered 2021-01-10: 15 mL via OROMUCOSAL

## 2021-01-10 MED ORDER — FENTANYL CITRATE (PF) 250 MCG/5ML IJ SOLN
INTRAMUSCULAR | Status: AC
  Filled 2021-01-10: qty 30

## 2021-01-10 MED ORDER — ROCURONIUM BROMIDE 10 MG/ML (PF) SYRINGE
PREFILLED_SYRINGE | INTRAVENOUS | Status: AC
  Filled 2021-01-10: qty 20

## 2021-01-10 MED ORDER — MIDAZOLAM HCL (PF) 10 MG/2ML IJ SOLN
INTRAMUSCULAR | Status: AC
  Filled 2021-01-10: qty 2

## 2021-01-10 MED ORDER — PHENYLEPHRINE HCL-NACL 20-0.9 MG/250ML-% IV SOLN
0.0000 ug/min | INTRAVENOUS | Status: DC
  Administered 2021-01-10: 60 ug/min via INTRAVENOUS
  Administered 2021-01-11 (×2): 45 ug/min via INTRAVENOUS
  Filled 2021-01-10 (×5): qty 250

## 2021-01-10 MED ORDER — ARTIFICIAL TEARS OPHTHALMIC OINT
TOPICAL_OINTMENT | OPHTHALMIC | Status: DC | PRN
  Administered 2021-01-10: 1 via OPHTHALMIC

## 2021-01-10 MED ORDER — METOCLOPRAMIDE HCL 5 MG/ML IJ SOLN
10.0000 mg | Freq: Four times a day (QID) | INTRAMUSCULAR | Status: AC
  Administered 2021-01-10 – 2021-01-11 (×4): 10 mg via INTRAVENOUS
  Filled 2021-01-10 (×4): qty 2

## 2021-01-10 MED ORDER — DEXTROSE 50 % IV SOLN: 0.0000 mL | INTRAVENOUS | Status: DC | PRN

## 2021-01-10 MED ORDER — MIDAZOLAM HCL 5 MG/5ML IJ SOLN
INTRAMUSCULAR | Status: DC | PRN
  Administered 2021-01-10: 4 mg via INTRAVENOUS
  Administered 2021-01-10: 2 mg via INTRAVENOUS
  Administered 2021-01-10: 4 mg via INTRAVENOUS

## 2021-01-10 MED ORDER — ONDANSETRON HCL 4 MG/2ML IJ SOLN: 4.0000 mg | Freq: Four times a day (QID) | INTRAMUSCULAR | Status: DC | PRN

## 2021-01-10 MED ORDER — SODIUM CHLORIDE (PF) 0.9 % IJ SOLN
INTRAMUSCULAR | Status: AC
  Filled 2021-01-10: qty 20

## 2021-01-10 MED FILL — Epinephrine Inj 30 MG/30ML (1 MG/ML) (1:1000): INTRAMUSCULAR | Qty: 5 | Status: AC

## 2021-01-10 MED FILL — Sodium Chloride IV Soln 0.9%: INTRAVENOUS | Qty: 250 | Status: AC

## 2021-01-10 SURGICAL SUPPLY — 106 items
ADAPTER CARDIO PERF ANTE/RETRO (ADAPTER) ×5 IMPLANT
BAG DECANTER FOR FLEXI CONT (MISCELLANEOUS) ×5 IMPLANT
BLADE CLIPPER SURG (BLADE) ×5 IMPLANT
BLADE STERNUM SYSTEM 6 (BLADE) ×5 IMPLANT
BLADE SURG 11 STRL SS (BLADE) ×5 IMPLANT
BLADE SURG 15 STRL LF DISP TIS (BLADE) ×4 IMPLANT
BLADE SURG 15 STRL SS (BLADE) ×1
BNDG ELASTIC 4X5.8 VLCR STR LF (GAUZE/BANDAGES/DRESSINGS) ×5 IMPLANT
BNDG ELASTIC 6X5.8 VLCR STR LF (GAUZE/BANDAGES/DRESSINGS) ×5 IMPLANT
BNDG GAUZE ELAST 4 BULKY (GAUZE/BANDAGES/DRESSINGS) ×5 IMPLANT
CANISTER SUCT 3000ML PPV (MISCELLANEOUS) ×5 IMPLANT
CANN PRFSN 3/8XRT ANG TPR 14 (MISCELLANEOUS) ×4
CANNULA EZ GLIDE AORTIC 21FR (CANNULA) ×5 IMPLANT
CANNULA GUNDRY RCSP 15FR (MISCELLANEOUS) ×5 IMPLANT
CANNULA PRFSN 3/8XRT ANG TPR14 (MISCELLANEOUS) ×4 IMPLANT
CANNULA SUMP PERICARDIAL (CANNULA) ×5 IMPLANT
CANNULA VEN MTL TIP RT (MISCELLANEOUS) ×1
CANNULA VRC MALB SNGL STG 36FR (MISCELLANEOUS) ×4 IMPLANT
CATH CPB KIT HENDRICKSON (MISCELLANEOUS) ×5 IMPLANT
CATH ROBINSON RED A/P 18FR (CATHETERS) ×20 IMPLANT
CATH THORACIC 36FR (CATHETERS) ×5 IMPLANT
CATH THORACIC 36FR RT ANG (CATHETERS) ×5 IMPLANT
CLIP FOGARTY SPRING 6M (CLIP) ×5 IMPLANT
CLIP VESOCCLUDE MED 24/CT (CLIP) IMPLANT
CLIP VESOCCLUDE SM WIDE 24/CT (CLIP) ×10 IMPLANT
CONN 1/2X1/2X1/2  BEN (MISCELLANEOUS) ×1
CONN 1/2X1/2X1/2 BEN (MISCELLANEOUS) ×4 IMPLANT
CONTAINER PROTECT SURGISLUSH (MISCELLANEOUS) ×10 IMPLANT
DEFOGGER ANTIFOG KIT (MISCELLANEOUS) ×5 IMPLANT
DERMABOND ADVANCED (GAUZE/BANDAGES/DRESSINGS) ×1
DERMABOND ADVANCED .7 DNX12 (GAUZE/BANDAGES/DRESSINGS) ×4 IMPLANT
DEVICE SUT CK QUICK LOAD MINI (Prosthesis & Implant Heart) ×5 IMPLANT
DRAPE CARDIOVASCULAR INCISE (DRAPES) ×1
DRAPE SRG 135X102X78XABS (DRAPES) ×4 IMPLANT
DRAPE WARM FLUID 44X44 (DRAPES) ×5 IMPLANT
DRSG COVADERM 4X14 (GAUZE/BANDAGES/DRESSINGS) ×5 IMPLANT
ELECT REM PT RETURN 9FT ADLT (ELECTROSURGICAL) ×10
ELECTRODE REM PT RTRN 9FT ADLT (ELECTROSURGICAL) ×8 IMPLANT
FELT TEFLON 1X6 (MISCELLANEOUS) ×10 IMPLANT
GAUZE SPONGE 4X4 12PLY STRL (GAUZE/BANDAGES/DRESSINGS) ×10 IMPLANT
GLOVE SURG SIGNA 7.5 PF LTX (GLOVE) ×15 IMPLANT
GOWN STRL REUS W/ TWL LRG LVL3 (GOWN DISPOSABLE) ×24 IMPLANT
GOWN STRL REUS W/ TWL XL LVL3 (GOWN DISPOSABLE) ×8 IMPLANT
GOWN STRL REUS W/TWL LRG LVL3 (GOWN DISPOSABLE) ×6
GOWN STRL REUS W/TWL XL LVL3 (GOWN DISPOSABLE) ×2
HEMOSTAT POWDER SURGIFOAM 1G (HEMOSTASIS) ×15 IMPLANT
HEMOSTAT SURGICEL 2X14 (HEMOSTASIS) ×5 IMPLANT
INSERT FOGARTY XLG (MISCELLANEOUS) ×5 IMPLANT
KIT BASIN OR (CUSTOM PROCEDURE TRAY) ×5 IMPLANT
KIT SUCTION CATH 14FR (SUCTIONS) ×10 IMPLANT
KIT SUT CK MINI COMBO 4X17 (Prosthesis & Implant Heart) ×5 IMPLANT
KIT TURNOVER KIT B (KITS) ×5 IMPLANT
KIT VASOVIEW HEMOPRO 2 VH 4000 (KITS) ×5 IMPLANT
LOOP VESSEL SUPERMAXI WHITE (MISCELLANEOUS) ×5 IMPLANT
MARKER GRAFT CORONARY BYPASS (MISCELLANEOUS) ×15 IMPLANT
NS IRRIG 1000ML POUR BTL (IV SOLUTION) ×25 IMPLANT
PACK E OPEN HEART (SUTURE) ×5 IMPLANT
PACK OPEN HEART (CUSTOM PROCEDURE TRAY) ×5 IMPLANT
PAD ARMBOARD 7.5X6 YLW CONV (MISCELLANEOUS) ×10 IMPLANT
PAD ELECT DEFIB RADIOL ZOLL (MISCELLANEOUS) ×5 IMPLANT
PENCIL BUTTON HOLSTER BLD 10FT (ELECTRODE) ×5 IMPLANT
POSITIONER HEAD DONUT 9IN (MISCELLANEOUS) ×5 IMPLANT
PUNCH AORTIC ROTATE 4.0MM (MISCELLANEOUS) IMPLANT
PUNCH AORTIC ROTATE 4.5MM 8IN (MISCELLANEOUS) ×5 IMPLANT
PUNCH AORTIC ROTATE 5MM 8IN (MISCELLANEOUS) IMPLANT
RING ANLPLS SIMUFORM 32 (Prosthesis & Implant Heart) ×4 IMPLANT
RING ANNULOPLASTY SIMUFORM 32 (Prosthesis & Implant Heart) ×1 IMPLANT
SET MPS 3-ND DEL (MISCELLANEOUS) ×5 IMPLANT
SUPPORT HEART JANKE-BARRON (MISCELLANEOUS) ×5 IMPLANT
SUT BONE WAX W31G (SUTURE) ×5 IMPLANT
SUT ETHIBOND (SUTURE) ×10 IMPLANT
SUT ETHIBOND 2 0 SH (SUTURE)
SUT ETHIBOND 2 0 SH 36X2 (SUTURE) IMPLANT
SUT ETHIBOND 2-0 RB-1 WHT (SUTURE) ×10 IMPLANT
SUT ETHIBOND 4 0 RB 1 (SUTURE) ×5 IMPLANT
SUT MNCRL AB 4-0 PS2 18 (SUTURE) ×5 IMPLANT
SUT PROLENE 3 0 SH DA (SUTURE) ×5 IMPLANT
SUT PROLENE 4 0 RB 1 (SUTURE) ×5
SUT PROLENE 4 0 SH DA (SUTURE) ×20 IMPLANT
SUT PROLENE 4-0 RB1 .5 CRCL 36 (SUTURE) ×20 IMPLANT
SUT PROLENE 6 0 C 1 30 (SUTURE) ×25 IMPLANT
SUT PROLENE 7 0 BV 1 (SUTURE) ×5 IMPLANT
SUT PROLENE 7 0 BV1 MDA (SUTURE) ×10 IMPLANT
SUT PROLENE 8 0 BV175 6 (SUTURE) IMPLANT
SUT STEEL 6MS V (SUTURE) ×5 IMPLANT
SUT STEEL STERNAL CCS#1 18IN (SUTURE) IMPLANT
SUT STEEL SZ 6 DBL 3X14 BALL (SUTURE) ×5 IMPLANT
SUT VIC AB 1 CTX 36 (SUTURE) ×2
SUT VIC AB 1 CTX36XBRD ANBCTR (SUTURE) ×8 IMPLANT
SUT VIC AB 2-0 CT1 27 (SUTURE) ×1
SUT VIC AB 2-0 CT1 TAPERPNT 27 (SUTURE) ×4 IMPLANT
SUT VIC AB 2-0 CTX 27 (SUTURE) IMPLANT
SUT VIC AB 3-0 SH 27 (SUTURE)
SUT VIC AB 3-0 SH 27X BRD (SUTURE) IMPLANT
SUT VIC AB 3-0 X1 27 (SUTURE) IMPLANT
SUT VICRYL 4-0 PS2 18IN ABS (SUTURE) IMPLANT
SYSTEM SAHARA CHEST DRAIN ATS (WOUND CARE) ×5 IMPLANT
TAPE CLOTH SURG 4X10 WHT LF (GAUZE/BANDAGES/DRESSINGS) ×5 IMPLANT
TAPE PAPER 2X10 WHT MICROPORE (GAUZE/BANDAGES/DRESSINGS) ×5 IMPLANT
TOWEL GREEN STERILE (TOWEL DISPOSABLE) ×5 IMPLANT
TOWEL GREEN STERILE FF (TOWEL DISPOSABLE) ×5 IMPLANT
TRAY FOLEY SLVR 16FR TEMP STAT (SET/KITS/TRAYS/PACK) ×5 IMPLANT
TUBING LAP HI FLOW INSUFFLATIO (TUBING) ×5 IMPLANT
UNDERPAD 30X36 HEAVY ABSORB (UNDERPADS AND DIAPERS) ×5 IMPLANT
VRC MALLEABLE SINGLE STG 36FR (MISCELLANEOUS) ×5
WATER STERILE IRR 1000ML POUR (IV SOLUTION) ×10 IMPLANT

## 2021-01-10 NOTE — Anesthesia Procedure Notes (Addendum)
Procedure Name: Intubation Date/Time: 01/10/2021 7:58 AM Performed by: Lance Coon, CRNA Pre-anesthesia Checklist: Patient identified, Emergency Drugs available, Suction available and Patient being monitored Patient Re-evaluated:Patient Re-evaluated prior to induction Oxygen Delivery Method: Circle System Utilized Preoxygenation: Pre-oxygenation with 100% oxygen Induction Type: IV induction Ventilation: Mask ventilation without difficulty and Oral airway inserted - appropriate to patient size Laryngoscope Size: Miller and 3 Grade View: Grade III Tube type: Oral Tube size: 8.0 mm Number of attempts: 2 Airway Equipment and Method: Stylet and Oral airway Placement Confirmation: ETT inserted through vocal cords under direct vision, positive ETCO2 and breath sounds checked- equal and bilateral Secured at: 22 cm Tube secured with: Tape Dental Injury: Teeth and Oropharynx as per pre-operative assessment

## 2021-01-10 NOTE — Progress Notes (Signed)
Rapid wean initiated per protocol  

## 2021-01-10 NOTE — Interval H&P Note (Signed)
History and Physical Interval Note:  01/10/2021 7:04 AM  Jonathan [name].  has presented today for surgery, with the diagnosis of CAD.  The various methods of treatment have been discussed with the patient and family. After consideration of risks, benefits and other options for treatment, the patient has consented to  Procedure(s): CORONARY ARTERY BYPASS GRAFTING (CABG) (N/A) TRANSESOPHAGEAL ECHOCARDIOGRAM (TEE) (N/A) as a surgical intervention.  The patient's history has been reviewed, patient examined, no change in status, stable for surgery.  I have reviewed the patient's chart and labs.  Questions were answered to the patient's satisfaction.     Melrose Nakayama

## 2021-01-10 NOTE — Transfer of Care (Signed)
Immediate Anesthesia Transfer of Care Note  Patient: Jonathan [name].  Procedure(s) Performed: CORONARY ARTERY BYPASS GRAFTING (CABG) TIMES FOUR, ON PUMP, USING LEFT INTERNAL MAMMARY ARTERY AND ENDOSCOPICALLY HARVESTED LEG VEIN (Chest) TRANSESOPHAGEAL ECHOCARDIOGRAM (TEE) MITRAL VALVE REPAIR (MVR) USING MEDTRONIC SIMUFORM 32MM RING ENDOVEIN HARVEST OF GREATER SAPHENOUS VEIN (Right) CHEST EXPLORATION FOR RETAINED FOREIGN OBJECT  Patient Location: ICU  Anesthesia Type:General  Level of Consciousness: patient cooperative and Patient remains intubated per anesthesia plan  Airway & Oxygen Therapy: Patient remains intubated per anesthesia plan and Patient placed on Ventilator (see vital sign flow sheet for setting)  Post-op Assessment: Report given to RN and Post -op Vital signs reviewed and stable  Post vital signs: Reviewed and stable  Last Vitals:  Vitals Value Taken Time  BP    Temp 35.8 C 01/10/21 1706  Pulse 88 01/10/21 1706  Resp 12 01/10/21 1706  SpO2 97 % 01/10/21 1706  Vitals shown include unvalidated device data.  Last Pain:  Vitals:   01/10/21 0606  TempSrc:   PainSc: 0-No pain      Patients Stated Pain Goal: 2 (84/12/82 0813)  Complications: No notable events documented.

## 2021-01-10 NOTE — Anesthesia Procedure Notes (Signed)
Central Venous Catheter Insertion Performed by: Nolon Nations, MD, anesthesiologist Start/End6/13/2022 6:55 AM, 01/10/2021 7:15 AM Patient location: Pre-op. Preanesthetic checklist: patient identified, IV checked, site marked, risks and benefits discussed, surgical consent, monitors and equipment checked, pre-op evaluation, timeout performed and anesthesia consent Position: Trendelenburg Lidocaine 1% used for infiltration and patient sedated Hand hygiene performed  and maximum sterile barriers used  Catheter size: 8.5 Fr Sheath introducer PA Cath depth:50 Procedure performed using ultrasound guided technique. Ultrasound Notes:anatomy identified, needle tip was noted to be adjacent to the nerve/plexus identified, no ultrasound evidence of intravascular and/or intraneural injection and image(s) printed for medical record Attempts: 1 Following insertion, line sutured, dressing applied and Biopatch. Post procedure assessment: blood return through all ports, free fluid flow and no air  Patient tolerated the procedure well with no immediate complications.

## 2021-01-10 NOTE — Plan of Care (Signed)
  Problem: Cardiac: Goal: Will achieve and/or maintain hemodynamic stability Outcome: Progressing   Problem: Clinical Measurements: Goal: Postoperative complications will be avoided or minimized Outcome: Progressing   Problem: Respiratory: Goal: Respiratory status will improve Outcome: Progressing   Problem: Skin Integrity: Goal: Wound healing without signs and symptoms of infection Outcome: Progressing Goal: Risk for impaired skin integrity will decrease Outcome: Progressing   Problem: Urinary Elimination: Goal: Ability to achieve and maintain adequate renal perfusion and functioning will improve Outcome: Progressing

## 2021-01-10 NOTE — Procedures (Signed)
Extubation Procedure Note  Patient Details:   Name: Virlan Kempker. DOB: 09-08-1950 MRN: [id number]   Airway Documentation:  Airway 8 mm (Active)  Secured at (cm) 22 cm 01/10/21 2305  Measured From Lips 01/10/21 2305  Secured Location Right 01/10/21 2305  Secured By Pink Tape 01/10/21 2305  Prone position No 01/10/21 2000  Cuff Pressure (cm H2O) MOV (Manual Technique) 01/10/21 2000  Site Condition Dry 01/10/21 2305   Vent end date: (not recorded) Vent end time: (not recorded)   Evaluation  O2 sats: stable throughout Complications: No apparent complications Patient did tolerate procedure well. Bilateral Breath Sounds: Clear, Diminished   Yes Pt extubated per rapid wean protocol.  Levora Dredge 01/10/2021, 11:37 PM

## 2021-01-10 NOTE — Progress Notes (Signed)
TCTS Evening Rounds DOS s/p CABG/Mvr Hemodyn stable; little CT output Waking up BP 110/65 (BP Location: Left Arm)   Pulse 92   Temp 97.7 F (36.5 C)   Resp 17   Ht 5\' 9"  (1.753 m)   Wt 90.7 kg   SpO2 100%   BMI 29.53 kg/m  CTA RRR CI 3.4  A/p: Work towards extubation Routine postoperative care. Eian Vandervelden Z. Orvan Seen, [place]

## 2021-01-10 NOTE — Anesthesia Procedure Notes (Signed)
Arterial Line Insertion Start/End6/13/2022 7:20 AM, 01/10/2021 7:25 AM Performed by: Nolon Nations, MD, anesthesiologist  Patient location: Pre-op. Preanesthetic checklist: patient identified, IV checked, site marked, risks and benefits discussed, surgical consent, monitors and equipment checked, pre-op evaluation, timeout performed and anesthesia consent Lidocaine 1% used for infiltration Left, radial was placed Catheter size: 20 Fr Hand hygiene performed , maximum sterile barriers used  and Seldinger technique used Allen's test indicative of satisfactory collateral circulation Attempts: 5 or more (3 attempts by CRNA, 2 by Lissa Hoard) Procedure performed using ultrasound guided technique. Ultrasound Notes:anatomy identified, needle tip was noted to be adjacent to the nerve/plexus identified and image(s) printed for medical record Following insertion, dressing applied and Biopatch. Post procedure assessment: normal and unchanged  Patient tolerated the procedure well with no immediate complications.

## 2021-01-10 NOTE — Brief Op Note (Addendum)
01/10/2021  8:00 AM  PATIENT:  Jonathan [name].  70 y.o. male  PRE-OPERATIVE DIAGNOSIS:  Coronary Artery Disease  POST-OPERATIVE DIAGNOSIS:  Coronary Artery Disease, Severe mitral regurgitation  PROCEDURE:  Procedure(s):  CORONARY ARTERY BYPASS GRAFTING x 4 -LIMA to LAD -SVG to DIAG -SVG to OM -SVG to DISTAL RCA  ENDOSCOPIC HARVEST GREATER SAPHENOUS VEIN -Right Leg (38/10 min)  MITRAL VALVE REPAIR (MVR) USING MEDTRONIC SIMUFORM 32MM RING  TRANSESOPHAGEAL ECHOCARDIOGRAM (TEE) (N/A)  SURGEON:  Surgeon(s) and Role:    Melrose Nakayama, MD - Primary  PHYSICIAN ASSISTANT: Ellwood Handler PA-C  ASSISTANTS: Ara Kussmaul RNFA   ANESTHESIA:   general  EBL:  563 mL   BLOOD ADMINISTERED:  CC CELLSAVER  DRAINS:  Left and Right Pleural Chest Tubes, Mediastinal Chest Drains    LOCAL MEDICATIONS USED:  NONE  SPECIMEN:  No Specimen  DISPOSITION OF SPECIMEN:  N/A  COUNTS:  YES  TOURNIQUET:  * No tourniquets in log *  DICTATION: .Dragon Dictation  PLAN OF CARE: Admit to inpatient   PATIENT DISPOSITION:  ICU - intubated and hemodynamically stable.   Delay start of Pharmacological VTE agent (>24hrs) due to surgical blood loss or risk of bleeding: yes  Findings pre-bypass TEE EF 20 to 30%, severe MR posterior leaflet restriction.  Post bypass TEE no residual MR, ejection fraction 30%.  LAD, first diagonal, and RCA good quality targets. OM poor quality target.  Good quality conduits.

## 2021-01-10 NOTE — Anesthesia Procedure Notes (Signed)
Central Venous Catheter Insertion Performed by: Nolon Nations, MD, anesthesiologist Start/End6/13/2022 7:15 AM, 01/10/2021 7:20 AM Patient location: Pre-op. Preanesthetic checklist: patient identified, IV checked, site marked, risks and benefits discussed, surgical consent, monitors and equipment checked, pre-op evaluation, timeout performed and anesthesia consent Hand hygiene performed  and maximum sterile barriers used  PA cath was placed.Swan type:thermodilution PA Cath depth:50 Procedure performed without using ultrasound guided technique. Attempts: 1 Patient tolerated the procedure well with no immediate complications.

## 2021-01-10 NOTE — Progress Notes (Signed)
Hemoglobin A1C that was drawn on 01/07/21 shows in process. Called the lab and per lab tech the A1C machine is down and labs are being sent to [facility] and are taking several days to result.

## 2021-01-10 NOTE — Hospital Course (Addendum)
History of Present Illness:  Jonathan [name], Jonathan [name] is a 70 year old man with multiple cardiac risk factors including hypertension, hyperlipidemia, type 2 diabetes, and tobacco abuse.  He quit smoking 30 years ago.  He recently presented to [facility] with shortness of breath.  He was diagnosed with acute diastolic congestive heart failure.  He was diuresed and improved clinically.  He was discharged and followed up with Dr. Peter Martinique.  Dr. Martinique did a right and left heart catheterization on 12/30/2020.  It revealed three-vessel coronary disease with preserved systolic function and normal LVEDP and right-sided heart pressures.  He was referred to [facility] for surgical evaluation.  He was evaluated by Dr. Roxan Hockey at which time the patient stated he had been having indigestion all the time.  The symptoms worsen with exertion.  He also complains of fatigue.  Dr. Roxan Hockey recommended the patient should undergo Coronary bypass grafting.  The risks and benefits of the procedure were explained to the patient and he was agreeable to proceed.  Hospital Course:  Jonathan [name] presented to [facility] on 01/10/2021.  He was taken to the operating room for surgery.  TEE obtained preoperatively showed severe Mitral Regurgitation.  It was felt the patient would require intervention on his Mitral Valve as well.  The patient underwent CABG x 4 utilizing LIMA to LAD, SVG to OM, SVG to Distal RCA, and SVG to Diagonal.  He also underwent Mitral Valve Repair with a 32 mm Edwards Simuform Ring and endoscopic harvest of the greater saphenous vein from his right leg.  He tolerated the procedure without difficulty and was taken to the SICU in stable condition.  The patient was extubated the evening of surgery.  During his stay in the SICU the patient was weaned off Neo synephrine, Milrinone, and Epinephrine as hemodynamics allow.  The patients chest tube exhibited a small air leak.  CXR showed no  evidence of pneumothorax.  There as atelectasis bilaterally. This resolved and his chest tubes were able to be removed on 01/12/2021.  Follow up CXR showed no pneumothorax or significant pleural effusion.   The patient complained of numbness in his right 4th and 5th finger, which as attributed to mild brachial plexopathy.  The patient was initiated on low dose coumadin for his MV Repair.  This will be continued for 6 weeks time.  He was started on IV diuretics for volume overloaded state.  His post operative thrombocytopenia worsened and his Lovenox was discontinued.  He was maintaining NSR and was transferred to the progressive care unit on 01/12/2021.  He was started on low dose Coreg.  This was titrated for tachycardia and PVCs.  He continued to make progress.  His pacing wires were removed without difficulty.  He remains on coumadin for his MV Repair.  His most recent INR is *** and he will be discharged home on *** mg of coumadin.  His surgical incisions are healing without evidence of infection.  He is ambulating independently.  He is felt medically stable for discharge home today.

## 2021-01-11 ENCOUNTER — Inpatient Hospital Stay (HOSPITAL_COMMUNITY): Payer: Medicare Other

## 2021-01-11 ENCOUNTER — Encounter (HOSPITAL_COMMUNITY): Payer: Self-pay | Admitting: Thoracic Surgery (Cardiothoracic Vascular Surgery)

## 2021-01-11 LAB — BASIC METABOLIC PANEL
Anion gap: 8 (ref 5–15)
Anion gap: 9 (ref 5–15)
BUN: 10 mg/dL (ref 8–23)
BUN: 14 mg/dL (ref 8–23)
CO2: 23 mmol/L (ref 22–32)
CO2: 23 mmol/L (ref 22–32)
Calcium: 6.8 mg/dL — ABNORMAL LOW (ref 8.9–10.3)
Calcium: 7 mg/dL — ABNORMAL LOW (ref 8.9–10.3)
Chloride: 110 mmol/L (ref 98–111)
Chloride: 107 mmol/L (ref 98–111)
Creatinine, Ser: 0.87 mg/dL (ref 0.61–1.24)
Creatinine, Ser: 1.01 mg/dL (ref 0.61–1.24)
GFR, Estimated: >60 mL/min (ref >60)
GFR, Estimated: >60 mL/min (ref >60)
Glucose, Bld: 147 mg/dL — ABNORMAL HIGH (ref 70–99)
Glucose, Bld: 167 mg/dL — ABNORMAL HIGH (ref 70–99)
Potassium: 4.4 mmol/L (ref 3.5–5.1)
Potassium: 4.7 mmol/L (ref 3.5–5.1)
Sodium: 141 mmol/L (ref 135–145)
Sodium: 139 mmol/L (ref 135–145)

## 2021-01-11 LAB — GLUCOSE, CAPILLARY
Glucose-Capillary: 124 mg/dL — ABNORMAL HIGH (ref 70–99)
Glucose-Capillary: 127 mg/dL — ABNORMAL HIGH (ref 70–99)
Glucose-Capillary: 132 mg/dL — ABNORMAL HIGH (ref 70–99)
Glucose-Capillary: 136 mg/dL — ABNORMAL HIGH (ref 70–99)
Glucose-Capillary: 140 mg/dL — ABNORMAL HIGH (ref 70–99)
Glucose-Capillary: 140 mg/dL — ABNORMAL HIGH (ref 70–99)
Glucose-Capillary: 142 mg/dL — ABNORMAL HIGH (ref 70–99)
Glucose-Capillary: 143 mg/dL — ABNORMAL HIGH (ref 70–99)
Glucose-Capillary: 148 mg/dL — ABNORMAL HIGH (ref 70–99)
Glucose-Capillary: 151 mg/dL — ABNORMAL HIGH (ref 70–99)
Glucose-Capillary: 152 mg/dL — ABNORMAL HIGH (ref 70–99)
Glucose-Capillary: 153 mg/dL — ABNORMAL HIGH (ref 70–99)
Glucose-Capillary: 156 mg/dL — ABNORMAL HIGH (ref 70–99)
Glucose-Capillary: 156 mg/dL — ABNORMAL HIGH (ref 70–99)

## 2021-01-11 LAB — CBC
HCT: 24.8 % — ABNORMAL LOW (ref 39.0–52.0)
HCT: 25.2 % — ABNORMAL LOW (ref 39.0–52.0)
Hemoglobin: 8.2 g/dL — ABNORMAL LOW (ref 13.0–17.0)
Hemoglobin: 8.3 g/dL — ABNORMAL LOW (ref 13.0–17.0)
MCH: 31.5 pg (ref 26.0–34.0)
MCH: 31.8 pg (ref 26.0–34.0)
MCHC: 33.1 g/dL (ref 30.0–36.0)
MCHC: 32.9 g/dL (ref 30.0–36.0)
MCV: 95.4 fL (ref 80.0–100.0)
MCV: 96.6 fL (ref 80.0–100.0)
Platelets: 96 10*3/uL — ABNORMAL LOW (ref 150–400)
Platelets: 79 10*3/uL — ABNORMAL LOW (ref 150–400)
RBC: 2.6 MIL/uL — ABNORMAL LOW (ref 4.22–5.81)
RBC: 2.61 MIL/uL — ABNORMAL LOW (ref 4.22–5.81)
RDW: 13.4 % (ref 11.5–15.5)
RDW: 13.7 % (ref 11.5–15.5)
WBC: 8.9 10*3/uL (ref 4.0–10.5)
WBC: 10.4 10*3/uL (ref 4.0–10.5)
nRBC: 0 % (ref 0.0–0.2)
nRBC: 0 % (ref 0.0–0.2)

## 2021-01-11 LAB — POCT I-STAT 7, (LYTES, BLD GAS, ICA,H+H)
Acid-base deficit: 4 mmol/L — ABNORMAL HIGH (ref 0.0–2.0)
Bicarbonate: 20.8 mmol/L (ref 20.0–28.0)
Calcium, Ion: 0.98 mmol/L — ABNORMAL LOW (ref 1.15–1.40)
HCT: 22 % — ABNORMAL LOW (ref 39.0–52.0)
Hemoglobin: 7.5 g/dL — ABNORMAL LOW (ref 13.0–17.0)
O2 Saturation: 98 %
Patient temperature: 37.5
Potassium: 3.9 mmol/L (ref 3.5–5.1)
Sodium: 146 mmol/L — ABNORMAL HIGH (ref 135–145)
TCO2: 22 mmol/L (ref 22–32)
pCO2 arterial: 38.3 mmHg (ref 32.0–48.0)
pH, Arterial: 7.346 — ABNORMAL LOW (ref 7.350–7.450)
pO2, Arterial: 108 mmHg (ref 83.0–108.0)

## 2021-01-11 LAB — MAGNESIUM
Magnesium: 2.6 mg/dL — ABNORMAL HIGH (ref 1.7–2.4)
Magnesium: 2.6 mg/dL — ABNORMAL HIGH (ref 1.7–2.4)

## 2021-01-11 MED ORDER — FUROSEMIDE 10 MG/ML IJ SOLN
20.0000 mg | Freq: Once | INTRAMUSCULAR | Status: AC
  Administered 2021-01-11: 20 mg via INTRAVENOUS
  Filled 2021-01-11: qty 2

## 2021-01-11 MED ORDER — INSULIN DETEMIR 100 UNIT/ML ~~LOC~~ SOLN
30.0000 [IU] | Freq: Two times a day (BID) | SUBCUTANEOUS | Status: DC
  Administered 2021-01-11 – 2021-01-12 (×2): 30 [IU] via SUBCUTANEOUS
  Filled 2021-01-11 (×5): qty 0.3

## 2021-01-11 MED ORDER — ENOXAPARIN SODIUM 40 MG/0.4ML IJ SOSY
40.0000 mg | PREFILLED_SYRINGE | Freq: Every day | INTRAMUSCULAR | Status: DC
  Administered 2021-01-11: 40 mg via SUBCUTANEOUS
  Filled 2021-01-11: qty 0.4

## 2021-01-11 MED ORDER — INSULIN ASPART 100 UNIT/ML IJ SOLN
0.0000 [IU] | INTRAMUSCULAR | Status: DC
  Administered 2021-01-11 – 2021-01-12 (×4): 2 [IU] via SUBCUTANEOUS

## 2021-01-11 NOTE — Anesthesia Postprocedure Evaluation (Signed)
Anesthesia Post Note  Patient: Kerin Cecchi.  Procedure(s) Performed: CORONARY ARTERY BYPASS GRAFTING (CABG) TIMES FOUR, ON PUMP, USING LEFT INTERNAL MAMMARY ARTERY AND ENDOSCOPICALLY HARVESTED LEG VEIN (Chest) TRANSESOPHAGEAL ECHOCARDIOGRAM (TEE) MITRAL VALVE REPAIR (MVR) USING MEDTRONIC SIMUFORM 32MM RING ENDOVEIN HARVEST OF GREATER SAPHENOUS VEIN (Right) CHEST EXPLORATION FOR RETAINED FOREIGN OBJECT     Patient location during evaluation: SICU Anesthesia Type: General Level of consciousness: patient cooperative and awake and alert Pain management: pain level controlled Vital Signs Assessment: post-procedure vital signs reviewed and stable Respiratory status: spontaneous breathing Cardiovascular status: stable Anesthetic complications: no   No notable events documented.  Last Vitals:  Vitals:   01/11/21 1745 01/11/21 1800  BP:  123/68  Pulse: 85 83  Resp: 18 12  Temp:    SpO2: 97% 96%    Last Pain:  Vitals:   01/11/21 1548  TempSrc: Oral  PainSc:                  Nolon Nations

## 2021-01-11 NOTE — Progress Notes (Addendum)
1 Day Post-Op Procedure(s) (LRB): CORONARY ARTERY BYPASS GRAFTING (CABG) TIMES FOUR, ON PUMP, USING LEFT INTERNAL MAMMARY ARTERY AND ENDOSCOPICALLY HARVESTED LEG VEIN (N/A) TRANSESOPHAGEAL ECHOCARDIOGRAM (TEE) (N/A) MITRAL VALVE REPAIR (MVR) USING MEDTRONIC SIMUFORM 32MM RING ENDOVEIN HARVEST OF GREATER SAPHENOUS VEIN (Right) CHEST EXPLORATION FOR RETAINED FOREIGN OBJECT Subjective: Some incisional pain, denies nausea  Objective: Vital signs in last 24 hours: Temp:  [96.26 F (35.7 C)-100.22 F (37.9 C)] 98.78 F (37.1 C) (06/14 0700) Pulse Rate:  [83-103] 87 (06/14 0700) Resp:  [10-24] 17 (06/14 0700) BP: (94-140)/(55-80) 103/58 (06/14 0700) SpO2:  [96 %-100 %] 97 % (06/14 0700) Arterial Line BP: (92-159)/(43-71) 117/51 (06/14 0700) FiO2 (%):  [40 %-50 %] 40 % (06/13 2305) Weight:  [98.1 kg] 98.1 kg (06/14 0323)  Hemodynamic parameters for last 24 hours: PAP: (23-46)/(9-32) 25/14 CO:  [5.4 L/min-7.1 L/min] 5.7 L/min CI:  [2.6 L/min/m2-3.4 L/min/m2] 2.8 L/min/m2  Intake/Output from previous day: 06/13 0701 - 06/14 0700 In: 8550.3 [P.O.:740; I.V.:4890.9; Blood:375; IV Piggyback:2544.4] Out: 7793 [Urine:6900; Blood:563; Chest Tube:330] Intake/Output this shift: No intake/output data recorded.  General appearance: alert, cooperative, and no distress Neurologic: numbness right 4th and 5th fingers Heart: regular rate and rhythm and + rub Lungs: diminished breath sounds bibasilar Abdomen: normal findings: soft, non-tender  Lab Results: Recent Labs    01/10/21 2256 01/10/21 2325 01/11/21 0057 01/11/21 0357  WBC 8.9  --   --  8.9  HGB 8.6*   < > 7.5* 8.2*  HCT 25.4*   < > 22.0* 24.8*  PLT 98*  --   --  96*   < > = values in this interval not displayed.   BMET:  Recent Labs    01/10/21 2256 01/10/21 2325 01/11/21 0057 01/11/21 0357  NA 141   < > 146* 141  K 3.5   < > 3.9 4.4  CL 108  --   --  110  CO2 24  --   --  23  GLUCOSE 127*  --   --  147*  BUN 9  --    --  10  CREATININE 0.88  --   --  0.87  CALCIUM 6.6*  --   --  6.8*   < > = values in this interval not displayed.    PT/INR:  Recent Labs    01/10/21 1710  LABPROT 18.1*  INR 1.5*   ABG    Component Value Date/Time   PHART 7.346 (L) 01/11/2021 0057   HCO3 20.8 01/11/2021 0057   TCO2 22 01/11/2021 0057   ACIDBASEDEF 4.0 (H) 01/11/2021 0057   O2SAT 98.0 01/11/2021 0057   CBG (last 3)  Recent Labs    01/11/21 0001 01/11/21 0054 01/11/21 0311  GLUCAP 140* 140* 156*    Assessment/Plan: S/P Procedure(s) (LRB): CORONARY ARTERY BYPASS GRAFTING (CABG) TIMES FOUR, ON PUMP, USING LEFT INTERNAL MAMMARY ARTERY AND ENDOSCOPICALLY HARVESTED LEG VEIN (N/A) TRANSESOPHAGEAL ECHOCARDIOGRAM (TEE) (N/A) MITRAL VALVE REPAIR (MVR) USING MEDTRONIC SIMUFORM 32MM RING ENDOVEIN HARVEST OF GREATER SAPHENOUS VEIN (Right) CHEST EXPLORATION FOR RETAINED FOREIGN OBJECT POD # 1 Doing well NEURO- numbness right 4th and 5th fingers- c/w mild brachial plexopathy CV- in SR, cardiac index good- dc milrinone, wean epi and neo  ASA, beta blocker, statin RESP_ IS for atelectasis  Small air leak will leave tubes in on water seal RENAL- creatinine and lytes oK, good UO  Gentlr diuresis ENDO- CBG controlled with insulin drip  Transition to levemir + SSI GI- advance diet as tolerated  Anemia secondary to ABL- follow Thrombocytopenia- mild, no bleeding issues SCD + enoxaparin for DVT prophylaxis Mobilize   LOS: 1 day    Melrose Nakayama 01/11/2021

## 2021-01-11 NOTE — Discharge Instructions (Addendum)
Discharge Instructions:  1. You may shower, please wash incisions daily with soap and water and keep dry.  If you wish to cover wounds with dressing you may do so but please keep clean and change daily.  No tub baths or swimming until incisions have completely healed.  If your incisions become red or develop any drainage please call our office at [phone]  2. No Driving until cleared by Dr. Leonarda Salon office and you are no longer using narcotic pain medications  3. Monitor your weight daily.. Please use the same scale and weigh at same time... If you gain 5-10 lbs in 48 hours with associated lower extremity swelling, please contact our office at [phone]  4. Fever of 101.5 for at least 24 hours with no source, please contact our office at [phone]  5. Activity- up as tolerated, please walk at least 3 times per day.  Avoid strenuous activity, no lifting, pushing, or pulling with your arms over 8-10 lbs for a minimum of 6 weeks  6. If any questions or concerns arise, please do not hesitate to contact our office at [phone]   Information on my medicine - Coumadin   (Warfarin)   Why was Coumadin prescribed for you? Coumadin was prescribed for you because you have a blood clot or a medical condition that can cause an increased risk of forming blood clots. Blood clots can cause serious health problems by blocking the flow of blood to the heart, lung, or brain. Coumadin can prevent harmful blood clots from forming. As a reminder your indication for Coumadin is: valve surgery  What test will check on my response to Coumadin? While on Coumadin (warfarin) you will need to have an INR test regularly to ensure that your dose is keeping you in the desired range. The INR (international normalized ratio) number is calculated from the result of the laboratory test called prothrombin time (PT).  If an INR APPOINTMENT HAS NOT ALREADY BEEN MADE FOR YOU please schedule an appointment to have  this lab work done by your health care provider within 7 days. Your INR goal is usually a number between:  2 to 3 or your provider may give you a more narrow range like 2-2.5.  Ask your health care provider during an office visit what your goal INR is.  What  do you need to  know  About  COUMADIN? Take Coumadin (warfarin) exactly as prescribed by your healthcare provider about the same time each day.  DO NOT stop taking without talking to the doctor who prescribed the medication.  Stopping without other blood clot prevention medication to take the place of Coumadin may increase your risk of developing a new clot or stroke.  Get refills before you run out.  What do you do if you miss a dose? If you miss a dose, take it as soon as you remember on the same day then continue your regularly scheduled regimen the next day.  Do not take two doses of Coumadin at the same time.  Important Safety Information A possible side effect of Coumadin (Warfarin) is an increased risk of bleeding. You should call your healthcare provider right away if you experience any of the following: Bleeding from an injury or your nose that does not stop. Unusual colored urine (red or dark brown) or unusual colored stools (red or black). Unusual bruising for unknown reasons. A serious fall or if you hit your head (even if there is no bleeding).  Some foods or medicines  interact with Coumadin (warfarin) and might alter your response to warfarin. To help avoid this: Eat a balanced diet, maintaining a consistent amount of Vitamin K. Notify your provider about major diet changes you plan to make. Avoid alcohol or limit your intake to 1 drink for women and 2 drinks for men per day. (1 drink is 5 oz. wine, 12 oz. beer, or 1.5 oz. liquor.)  Make sure that ANY health care provider who prescribes medication for you knows that you are taking Coumadin (warfarin).  Also make sure the healthcare provider who is monitoring your Coumadin  knows when you have started a new medication including herbals and non-prescription products.  Coumadin (Warfarin)  Major Drug Interactions  Increased Warfarin Effect Decreased Warfarin Effect  Alcohol (large quantities) Antibiotics (esp. Septra/Bactrim, Flagyl, Cipro) Amiodarone (Cordarone) Aspirin (ASA) Cimetidine (Tagamet) Megestrol (Megace) NSAIDs (ibuprofen, naproxen, etc.) Piroxicam (Feldene) Propafenone (Rythmol SR) Propranolol (Inderal) Isoniazid (INH) Posaconazole (Noxafil) Barbiturates (Phenobarbital) Carbamazepine (Tegretol) Chlordiazepoxide (Librium) Cholestyramine (Questran) Griseofulvin Oral Contraceptives Rifampin Sucralfate (Carafate) Vitamin K   Coumadin (Warfarin) Major Herbal Interactions  Increased Warfarin Effect Decreased Warfarin Effect  Garlic Ginseng Ginkgo biloba Coenzyme Q10 Green tea St. John's wort    Coumadin (Warfarin) FOOD Interactions  Eat a consistent number of servings per week of foods HIGH in Vitamin K (1 serving =  cup)  Collards (cooked, or boiled & drained) Kale (cooked, or boiled & drained) Mustard greens (cooked, or boiled & drained) Parsley *serving size only =  cup Spinach (cooked, or boiled & drained) Swiss chard (cooked, or boiled & drained) Turnip greens (cooked, or boiled & drained)  Eat a consistent number of servings per week of foods MEDIUM-HIGH in Vitamin K (1 serving = 1 cup)  Asparagus (cooked, or boiled & drained) Broccoli (cooked, boiled & drained, or raw & chopped) Brussel sprouts (cooked, or boiled & drained) *serving size only =  cup Lettuce, raw (green leaf, endive, romaine) Spinach, raw Turnip greens, raw & chopped   These websites have more information on Coumadin (warfarin):  FailFactory.se; VeganReport.com.au;

## 2021-01-11 NOTE — Progress Notes (Signed)
Patient ID: Jonathan Tavano., male   DOB: Jun 11, 1951, 70 y.o.   MRN: [id number] TCTS Evening Rounds:  Hemodynamically stable in sinus rhythm. Still on 45 mcg neo and epi just turned off.   Chest tube output low.  Urine output ok today.  Off insulin drip.  BMET    Component Value Date/Time   NA 139 01/11/2021 1553   NA 139 12/22/2020 1546   K 4.7 01/11/2021 1553   CL 107 01/11/2021 1553   CO2 23 01/11/2021 1553   GLUCOSE 167 (H) 01/11/2021 1553   BUN 14 01/11/2021 1553   BUN 27 12/22/2020 1546   CREATININE 1.01 01/11/2021 1553   CALCIUM 7.0 (L) 01/11/2021 1553   GFRNONAA >60 01/11/2021 1553   CBC    Component Value Date/Time   WBC 10.4 01/11/2021 1553   RBC 2.61 (L) 01/11/2021 1553   HGB 8.3 (L) 01/11/2021 1553   HGB 16.1 12/22/2020 1546   HCT 25.2 (L) 01/11/2021 1553   HCT 47.5 12/22/2020 1546   PLT 79 (L) 01/11/2021 1553   PLT 172 12/22/2020 1546   MCV 96.6 01/11/2021 1553   MCV 93 12/22/2020 1546   MCH 31.8 01/11/2021 1553   MCHC 32.9 01/11/2021 1553   RDW 13.7 01/11/2021 1553   RDW 13.0 12/22/2020 1546   LYMPHSABS 3.3 (H) 12/22/2020 1546   EOSABS 0.1 12/22/2020 1546   BASOSABS 0.1 12/22/2020 1546   Wean neo as tolerated.

## 2021-01-12 ENCOUNTER — Inpatient Hospital Stay (HOSPITAL_COMMUNITY): Payer: Medicare Other

## 2021-01-12 LAB — CBC
HCT: 24.5 % — ABNORMAL LOW (ref 39.0–52.0)
Hemoglobin: 8.2 g/dL — ABNORMAL LOW (ref 13.0–17.0)
MCH: 32.2 pg (ref 26.0–34.0)
MCHC: 33.5 g/dL (ref 30.0–36.0)
MCV: 96.1 fL (ref 80.0–100.0)
Platelets: 73 10*3/uL — ABNORMAL LOW (ref 150–400)
RBC: 2.55 MIL/uL — ABNORMAL LOW (ref 4.22–5.81)
RDW: 13.7 % (ref 11.5–15.5)
WBC: 7.9 10*3/uL (ref 4.0–10.5)
nRBC: 0 % (ref 0.0–0.2)

## 2021-01-12 LAB — GLUCOSE, CAPILLARY
Glucose-Capillary: 107 mg/dL — ABNORMAL HIGH (ref 70–99)
Glucose-Capillary: 240 mg/dL — ABNORMAL HIGH (ref 70–99)
Glucose-Capillary: 94 mg/dL (ref 70–99)
Glucose-Capillary: 67 mg/dL — ABNORMAL LOW (ref 70–99)
Glucose-Capillary: 148 mg/dL — ABNORMAL HIGH (ref 70–99)
Glucose-Capillary: 97 mg/dL (ref 70–99)

## 2021-01-12 LAB — BASIC METABOLIC PANEL
Anion gap: 7 (ref 5–15)
BUN: 21 mg/dL (ref 8–23)
CO2: 24 mmol/L (ref 22–32)
Calcium: 7.3 mg/dL — ABNORMAL LOW (ref 8.9–10.3)
Chloride: 108 mmol/L (ref 98–111)
Creatinine, Ser: 0.94 mg/dL (ref 0.61–1.24)
GFR, Estimated: >60 mL/min (ref >60)
Glucose, Bld: 109 mg/dL — ABNORMAL HIGH (ref 70–99)
Potassium: 3.9 mmol/L (ref 3.5–5.1)
Sodium: 139 mmol/L (ref 135–145)

## 2021-01-12 MED ORDER — INSULIN ASPART 100 UNIT/ML IJ SOLN: 0.0000 [IU] | Freq: Every day | INTRAMUSCULAR | Status: DC

## 2021-01-12 MED ORDER — FUROSEMIDE 40 MG PO TABS
40.0000 mg | ORAL_TABLET | Freq: Every day | ORAL | Status: DC
  Administered 2021-01-12 – 2021-01-14 (×3): 40 mg via ORAL
  Filled 2021-01-12 (×4): qty 1

## 2021-01-12 MED ORDER — SODIUM CHLORIDE 0.9% FLUSH
3.0000 mL | INTRAVENOUS | Status: DC | PRN
  Administered 2021-01-14: 3 mL via INTRAVENOUS

## 2021-01-12 MED ORDER — POTASSIUM CHLORIDE CRYS ER 20 MEQ PO TBCR
20.0000 meq | EXTENDED_RELEASE_TABLET | Freq: Two times a day (BID) | ORAL | Status: DC
  Administered 2021-01-12 (×2): 20 meq via ORAL
  Filled 2021-01-12 (×2): qty 1

## 2021-01-12 MED ORDER — SODIUM CHLORIDE 0.9 % IV SOLN: 250.0000 mL | INTRAVENOUS | Status: DC | PRN

## 2021-01-12 MED ORDER — INSULIN ASPART 100 UNIT/ML IJ SOLN
0.0000 [IU] | Freq: Three times a day (TID) | INTRAMUSCULAR | Status: DC
  Administered 2021-01-12: 2 [IU] via SUBCUTANEOUS
  Administered 2021-01-12: 5 [IU] via SUBCUTANEOUS

## 2021-01-12 MED ORDER — METFORMIN HCL 500 MG PO TABS
500.0000 mg | ORAL_TABLET | Freq: Two times a day (BID) | ORAL | Status: DC
  Administered 2021-01-12 – 2021-01-14 (×6): 500 mg via ORAL
  Filled 2021-01-12 (×7): qty 1

## 2021-01-12 MED ORDER — FUROSEMIDE 10 MG/ML IJ SOLN
20.0000 mg | Freq: Once | INTRAMUSCULAR | Status: AC
  Administered 2021-01-12: 20 mg via INTRAVENOUS
  Filled 2021-01-12: qty 2

## 2021-01-12 MED ORDER — INSULIN DETEMIR 100 UNIT/ML ~~LOC~~ SOLN
20.0000 [IU] | Freq: Two times a day (BID) | SUBCUTANEOUS | Status: DC
  Administered 2021-01-12 (×2): 20 [IU] via SUBCUTANEOUS
  Filled 2021-01-12 (×5): qty 0.2

## 2021-01-12 MED ORDER — MAGNESIUM HYDROXIDE 400 MG/5ML PO SUSP: 30.0000 mL | Freq: Every day | ORAL | Status: DC | PRN

## 2021-01-12 MED ORDER — WARFARIN - PHYSICIAN DOSING INPATIENT: Freq: Every day | Status: DC

## 2021-01-12 MED ORDER — SODIUM CHLORIDE 0.9% FLUSH
3.0000 mL | Freq: Two times a day (BID) | INTRAVENOUS | Status: DC
  Administered 2021-01-12 – 2021-01-14 (×6): 3 mL via INTRAVENOUS

## 2021-01-12 MED ORDER — WARFARIN SODIUM 2.5 MG PO TABS
2.5000 mg | ORAL_TABLET | Freq: Every day | ORAL | Status: DC
  Administered 2021-01-12 – 2021-01-13 (×2): 2.5 mg via ORAL
  Filled 2021-01-12 (×2): qty 1

## 2021-01-12 MED ORDER — ~~LOC~~ CARDIAC SURGERY, PATIENT & FAMILY EDUCATION: Freq: Once | Status: AC

## 2021-01-12 MED ORDER — ALUM & MAG HYDROXIDE-SIMETH 200-200-20 MG/5ML PO SUSP
15.0000 mL | Freq: Four times a day (QID) | ORAL | Status: DC | PRN
  Administered 2021-01-14: 15 mL via ORAL
  Filled 2021-01-12: qty 30

## 2021-01-12 MED ORDER — ASPIRIN EC 81 MG PO TBEC
81.0000 mg | DELAYED_RELEASE_TABLET | Freq: Every day | ORAL | Status: DC
  Administered 2021-01-12 – 2021-01-14 (×3): 81 mg via ORAL
  Filled 2021-01-12 (×4): qty 1

## 2021-01-12 NOTE — Progress Notes (Signed)
2 Days Post-Op Procedure(s) (LRB): CORONARY ARTERY BYPASS GRAFTING (CABG) TIMES FOUR, ON PUMP, USING LEFT INTERNAL MAMMARY ARTERY AND ENDOSCOPICALLY HARVESTED LEG VEIN (N/A) TRANSESOPHAGEAL ECHOCARDIOGRAM (TEE) (N/A) MITRAL VALVE REPAIR (MVR) USING MEDTRONIC SIMUFORM 32MM RING ENDOVEIN HARVEST OF GREATER SAPHENOUS VEIN (Right) CHEST EXPLORATION FOR RETAINED FOREIGN OBJECT Subjective: Some incisional pain, walked around unit yesterday evening  Objective: Vital signs in last 24 hours: Temp:  [97.9 F (36.6 C)-99.1 F (37.3 C)] 98.6 F (37 C) (06/15 0400) Pulse Rate:  [77-89] 84 (06/15 0700) Resp:  [9-30] 10 (06/15 0700) BP: (84-134)/(53-75) 116/56 (06/15 0700) SpO2:  [93 %-99 %] 99 % (06/15 0700) Arterial Line BP: (81-159)/(44-78) 136/52 (06/15 0700) Weight:  [95.2 kg] 95.2 kg (06/15 0500)  Hemodynamic parameters for last 24 hours: PAP: (23-40)/(9-24) 36/17 CO:  [6.9 L/min] 6.9 L/min CI:  [3.3 L/min/m2] 3.3 L/min/m2  Intake/Output from previous day: 06/14 0701 - 06/15 0700 In: 1372 [I.V.:1025.9; IV Piggyback:346.1] Out: 1865 [Urine:1535; Chest Tube:330] Intake/Output this shift: No intake/output data recorded.  General appearance: alert, cooperative, and no distress Neurologic: intact Heart: regular rate and rhythm Lungs: diminished breath sounds bibasilar Abdomen: normal findings: soft, non-tender  Lab Results: Recent Labs    01/11/21 1553 01/12/21 0458  WBC 10.4 7.9  HGB 8.3* 8.2*  HCT 25.2* 24.5*  PLT 79* 73*   BMET:  Recent Labs    01/11/21 1553 01/12/21 0458  NA 139 139  K 4.7 3.9  CL 107 108  CO2 23 24  GLUCOSE 167* 109*  BUN 14 21  CREATININE 1.01 0.94  CALCIUM 7.0* 7.3*    PT/INR:  Recent Labs    01/10/21 1710  LABPROT 18.1*  INR 1.5*   ABG    Component Value Date/Time   PHART 7.346 (L) 01/11/2021 0057   HCO3 20.8 01/11/2021 0057   TCO2 22 01/11/2021 0057   ACIDBASEDEF 4.0 (H) 01/11/2021 0057   O2SAT 98.0 01/11/2021 0057   CBG  (last 3)  Recent Labs    01/11/21 1545 01/11/21 2025 01/12/21 0449  GLUCAP 153* 142* 107*    Assessment/Plan: S/P Procedure(s) (LRB): CORONARY ARTERY BYPASS GRAFTING (CABG) TIMES FOUR, ON PUMP, USING LEFT INTERNAL MAMMARY ARTERY AND ENDOSCOPICALLY HARVESTED LEG VEIN (N/A) TRANSESOPHAGEAL ECHOCARDIOGRAM (TEE) (N/A) MITRAL VALVE REPAIR (MVR) USING MEDTRONIC SIMUFORM 32MM RING ENDOVEIN HARVEST OF GREATER SAPHENOUS VEIN (Right) CHEST EXPLORATION FOR RETAINED FOREIGN OBJECT - POD # 2 Doing well NEURO- intact CV- in SR, off drips  Decrease ASA to 81 mg, start low dose warfarin for 6 weeks  Restart entresto prior to dc RESP- IS for atelectasis  No air leak- Dc chest tubes RENAL- creatinine and lytes OK  IV Lasix this AM ENDO- CBG well controlled with levemir + SSI  Change SSI to [facility] and HS, restart metformin  Wait another day or two to restart [place] [facility] -advance diet Thrombocytopenia- PLT count down slightly- hold enoxaparin  SCD + ambulation for DVT prophylaxis Anemia secondary to ABL- mild, follow   LOS: 2 days    Jonathan [name] 01/12/2021

## 2021-01-12 NOTE — Progress Notes (Signed)
Pt arrived to 4E from [place]. CHG bath done. Connected to tele. Oriented to the room. Sister at bedside

## 2021-01-12 NOTE — Progress Notes (Signed)
Mobility Specialist: Progress Note   01/12/21 1803  Mobility  Activity Refused mobility   Pt refused mobility stating he just walked in the ICU before coming up to the unit. Pt c/o feeling sore as well, RN and NT present in the room.   [facility] Mobility Specialist Mobility Specialist Phone: [phone]

## 2021-01-12 NOTE — Op Note (Signed)
NAMEWILMOT, Jonathan [name] MEDICAL RECORD NO: [id number] ACCOUNT NO: [id number] DATE OF BIRTH: August 14, 1950 FACILITY: [facility] LOCATION: [facility] PHYSICIAN: Revonda Standard. Roxan Hockey, MD  Operative Report   DATE OF PROCEDURE: 01/10/2021  PREOPERATIVE DIAGNOSIS:  Three-vessel coronary artery disease.  POSTOPERATIVE DIAGNOSES:  Three-vessel coronary artery disease and severe mitral regurgitation.  PROCEDURES PERFORMED: Median sternotomy, extracorporeal circulation, Coronary artery bypass grafting x4 Left internal mammary artery to left anterior descending, Saphenous vein graft to first diagonal, Saphenous vein graft to the obtuse marginal 1, and  Saphenous vein graft to distal right coronary, Endoscopic vein harvest right leg. Mitral valve repair using 32 mm Medtronic SimuForm ring annuloplasty.  SURGEON:  Modesto Charon, MD  ASSISTANT:  Ellwood Handler, PA  ANESTHESIA:  General.  FINDINGS:  Prebypass transesophageal echocardiography showed cardiomegaly with ejection fraction of 20-30%, dilated left atrium and severe mitral regurgitation.  The post-bypass TEE showed no residual mitral regurgitation, ejection fraction approximately  30%.  Intraoperative findings:  Good quality conduits, OM poor quality target, remaining targets good quality.  Cardiomegaly.  Posterior leaflet restriction with possible single ruptured chord to anterior leaflet. No residual MR post repair.  CLINICAL NOTE:  Mr. Jonathan [name] is a 70 year old man with multiple cardiac risk factors who presented with shortness of breath and was diagnosed with acute diastolic congestive heart failure.  He had right and left heart catheterization, which revealed  3-vessel disease with preserved systolic function and normal filling pressures.  He was referred for coronary artery bypass grafting.  The indications, risks, benefits, and alternatives were discussed in detail with the patient.  He understood and  accepted the risks and agreed to  proceed.  DESCRIPTION OF PROCEDURE:  [name] was brought to the preoperative holding area on 01/10/2021.  Anesthesia placed a Swan-Ganz catheter and an arterial blood pressure monitoring line.   He was taken to the operating room and anesthetized and intubated.   Intravenous antibiotics were administered.  A Foley catheter was placed.  Transesophageal echocardiography was performed by Dr. Lissa Hoard. Please see his separately dictated note for full details.  There was LV dilatation with ejection fraction of 20-30%.   There was severe mitral regurgitation with an eccentric posteriorly directed jet with some restriction of the posterior leaflet and anterior leaflet slightly overriding that.  There was no definite prolapse.  The chest, abdomen and legs were prepped and  draped in the usual sterile fashion.  A median sternotomy was performed.  The left internal mammary artery was harvested in standard fashion.  Of note, the patient's chest wall was very noncompliant. Incision was made in the medial aspect of the right leg at the level of the knee.  The  greater saphenous vein was harvested from the mid calf to groin endoscopically.  The saphenous vein was of good quality as well as the mammary artery. 2000 units of heparin was administered during the vessel harvest.  The remainder of the full heparin dose was given prior to opening the pericardium.  After harvesting the conduits, the pericardium was opened.  There was massive cardiomegaly.  The ascending aorta was approximately 4 cm in diameter.  After confirming adequate anticoagulation with ACT measurement, the aorta was cannulated via concentric  2-0 Ethibond pledgeted pursestring sutures.  A 31-French metal tip venous cannula was placed via a pursestring suture in the superior vena cava.  Cardiopulmonary bypass was initiated.  A 36-French malleable cannula was placed via pursestring suture in  the inferior aspect of the right atrium and directed into  the inferior  vena cava. Caval tapes were placed, but were only tightened during the valve portion of the procedure.  The inferior vena caval cannula was attached to the bypass circuit and full  flow was commenced.  The coronary arteries were inspected and anastomotic sites were chosen.  The conduits were inspected and cut to length.  A foam pad was placed in the pericardium to insulate the heart. A temperature probe was placed in the  myocardial septum.  Retrograde cardioplegic cannula was placed via pursestring suture in the right atrium and directed into the coronary sinus.  An antegrade cardioplegia cannula was placed in the ascending aorta.  The aorta was cross clamped.  The left ventricle was emptied via the aortic root vent. Cardiac arrest then was achieved with a combination of cold antegrade and retrograde blood cardioplegia and topical iced saline. Cardioplegia was administered until  there was complete diastolic arrest and septal cooling to 11 degrees Celsius.  A reversed saphenous vein graft was placed end-to-side to the distal right coronary.  This was a 2 mm good quality target vessel.  The vein was of good quality.  The end-to-side anastomosis was performed with a running 7-0 Prolene suture.  At completion of  the anastomosis, the probe passed easily distally.  Cardioplegia was administered.  There was good flow and good hemostasis.  The heart was elevated to inspect the lateral wall.  The diagonal branch had been identified.  This was a high anterolateral branch that ran almost parallel to the AV groove.  It was intramyocardial.  It was a 2 mm good quality target at the site of  anastomosis.  The vein graft was of good quality.  It was anastomosed end-to-side with a running 7-0 Prolene suture.  Additional cardioplegia was administered.  The heart was then elevated again and OM1 had been identified very distally.  It also ran  parallel to the AV groove for a long portion but the part that  was approachable for grafting was very small, it did accept a 1 mm probe.  It was poor quality target vessel.  It was grafted using a saphenous vein.  The end-to-side anastomosis was  performed with running 7-0 Prolene suture.  A probe did pass distally at the completion of the anastomosis.  Cardioplegia was administered down this graft with good flow and good hemostasis.  The left internal mammary artery was brought through a window in the pericardium.  The distal end was beveled.  It was anastomosed end-to-side to the distal LAD with a running 8-0 Prolene suture.  Both the mammary and LAD were good quality vessels.  The  end-to-side anastomosis was performed with a running 8-0 Prolene suture.  The LAD was a 2 mm good quality target.  The  mammary was of good quality.  The anastomosis was performed with a running 8-0 Prolene suture.  At the completion of the anastomosis, the bulldog clamp was removed.  Rapid septal rewarming was noted.  The bulldog clamp was replaced.  The mammary  pedicle was tacked to the epicardial surface of the heart with 6-0 Prolene sutures.  Additional cardioplegia was administered.  It should be noted that the carbon dioxide was insufflated into the operative field, beginning at the initiation of cardiopulmonary bypass.  The interatrial groove was dissected out and the left atriotomy was performed.  A retractor was placed.  The left  atrium was massively enlarged.  The mitral valve was visible.  Once the retractor was placed, the mitral valve could  be inspected.  The anterior leaflet measured for a 32 mm SimuForm annuloplasty ring.  Inspection of the leaflets showed some restriction  near the posteromedial commissure.  This was of the posterior leaflet.  There was some annular dilatation in this area.  The anterior leaflet was not definitively prolapsed but there was a small nub of tissue on the anterior leaflet, suggesting a  possible ruptured chord. Visualization into the left  ventricle was limited due to the massive size of the heart.  This area was imbricated with a 4-0 Ethibond suture.  2-0 Ethibond horizontal mattress sutures then were placed circumferentially around the  annulus.  The sutures were placed through the sewing ring of the annuloplasty ring.  The ring then was lowered into place and the sutures were secured with a Cor-Knot device.  The inner support for the ring was removed.  The left ventricle was distended  with iced saline and the valve was competent.  The weighted vent was left in the left ventricle for de-airing purposes.  The left atriotomy was closed with 2 layers of running 4-0 Prolene suture.  The first layer was a running horizontal mattress suture  and deairing was performed before tying that suture and then the second layer was a running 4-0 Prolene simple suture.  It should be noted that additional cardioplegia was administered at 20 minute intervals during the open portion of the procedure.  Additional cardioplegia was administered down the vein grafts.  They were cut to length.  The cardioplegia cannula was removed from the ascending aorta.  The proximal vein graft anastomoses were performed to 4.5 mm punch aortotomies with running 6-0  Prolene sutures.  At the completion of the final proximal anastomosis, the patient was placed in Trendelenburg position.  Lidocaine was administered.  The aortic root was deaired and the aortic crossclamp was removed.  The total crossclamp time was 175  minutes.  While rewarming was begun, all proximal and distal anastomoses were inspected for hemostasis.  Epicardial pacing wires were placed on the right ventricle and right atrium.  When the patient had rewarmed to a core temperature of 37 degrees Celsius, a  trial wean was performed.  The patient was started on milrinone at 0.25 mcg per kilogram per minute and epinephrine at 3 mcg per minute.  The left ventricle was initially sluggish, but improved with time as  the flow was gradually decreased, after  confirming there was no residual mitral regurgitation, the patient was placed back on full bypass.  The superior vena caval cannula was redirected into the right atrium.  The inferior vena caval cannula was removed.  The patient then was weaned from  bypass without difficulty.  Total bypass time was 236 minutes.  Post-bypass transesophageal echocardiography showed some improvement in wall motion with ejection fraction of approximately 30%.  There was no residual mitral regurgitation.  The patient remained hemodynamically stable throughout the post-bypass.  A test  dose of protamine was administered.  The superior vena caval cannula was removed.  There was some bleeding there which was controlled with direct pressure and a suture was placed.  The aortic cannula then was removed.  The remainder of the protamine was  administered without incident.  The chest was copiously irrigated with warm saline.  Hemostasis was achieved.  Left pleural and mediastinal chest tubes were placed through separate subcostal incisions and secured with #1 silk sutures.  The sternum was  closed with a combination of single and double heavy gauge stainless  steel wires.  The pectoralis fascia, subcutaneous tissue and skin were closed in standard fashion.  All sponge, needle and instrument counts were correct at the end of the procedure.  The patient was taken from the operating room to the surgical intensive care unit, intubated and in good condition.   SHW D: 01/11/2021 6:14:48 pm T: 01/12/2021 2:48:00 am  JOB: [id number]/ [id number]

## 2021-01-13 ENCOUNTER — Inpatient Hospital Stay (HOSPITAL_COMMUNITY): Payer: Medicare Other

## 2021-01-13 LAB — GLUCOSE, CAPILLARY
Glucose-Capillary: 125 mg/dL — ABNORMAL HIGH (ref 70–99)
Glucose-Capillary: 135 mg/dL — ABNORMAL HIGH (ref 70–99)
Glucose-Capillary: 59 mg/dL — ABNORMAL LOW (ref 70–99)
Glucose-Capillary: 87 mg/dL (ref 70–99)
Glucose-Capillary: 96 mg/dL (ref 70–99)

## 2021-01-13 LAB — CBC
HCT: 23.4 % — ABNORMAL LOW (ref 39.0–52.0)
Hemoglobin: 7.8 g/dL — ABNORMAL LOW (ref 13.0–17.0)
MCH: 31.7 pg (ref 26.0–34.0)
MCHC: 33.3 g/dL (ref 30.0–36.0)
MCV: 95.1 fL (ref 80.0–100.0)
Platelets: 77 10*3/uL — ABNORMAL LOW (ref 150–400)
RBC: 2.46 MIL/uL — ABNORMAL LOW (ref 4.22–5.81)
RDW: 13.2 % (ref 11.5–15.5)
WBC: 9.2 10*3/uL (ref 4.0–10.5)
nRBC: 0.2 % (ref 0.0–0.2)

## 2021-01-13 LAB — BASIC METABOLIC PANEL
Anion gap: 6 (ref 5–15)
BUN: 20 mg/dL (ref 8–23)
CO2: 26 mmol/L (ref 22–32)
Calcium: 7.6 mg/dL — ABNORMAL LOW (ref 8.9–10.3)
Chloride: 106 mmol/L (ref 98–111)
Creatinine, Ser: 0.79 mg/dL (ref 0.61–1.24)
GFR, Estimated: >60 mL/min (ref >60)
Glucose, Bld: 60 mg/dL — ABNORMAL LOW (ref 70–99)
Potassium: 3.5 mmol/L (ref 3.5–5.1)
Sodium: 138 mmol/L (ref 135–145)

## 2021-01-13 LAB — PROTIME-INR
INR: 1.3 — ABNORMAL HIGH (ref 0.8–1.2)
Prothrombin Time: 16.6 seconds — ABNORMAL HIGH (ref 11.4–15.2)

## 2021-01-13 MED ORDER — INSULIN ASPART 100 UNIT/ML IJ SOLN
0.0000 [IU] | Freq: Every day | INTRAMUSCULAR | Status: DC
  Administered 2021-01-14: 2 [IU] via SUBCUTANEOUS

## 2021-01-13 MED ORDER — POTASSIUM CHLORIDE CRYS ER 20 MEQ PO TBCR: 20.0000 meq | EXTENDED_RELEASE_TABLET | Freq: Every day | ORAL | Status: DC

## 2021-01-13 MED ORDER — POTASSIUM CHLORIDE CRYS ER 20 MEQ PO TBCR
40.0000 meq | EXTENDED_RELEASE_TABLET | Freq: Two times a day (BID) | ORAL | Status: AC
  Administered 2021-01-13 (×2): 40 meq via ORAL
  Filled 2021-01-13 (×2): qty 2

## 2021-01-13 MED ORDER — CARVEDILOL 3.125 MG PO TABS
3.1250 mg | ORAL_TABLET | Freq: Two times a day (BID) | ORAL | Status: DC
  Administered 2021-01-13 (×2): 3.125 mg via ORAL
  Filled 2021-01-13 (×2): qty 1

## 2021-01-13 MED ORDER — INSULIN ASPART 100 UNIT/ML IJ SOLN
0.0000 [IU] | Freq: Three times a day (TID) | INTRAMUSCULAR | Status: DC
  Administered 2021-01-13: 1 [IU] via SUBCUTANEOUS
  Administered 2021-01-14: 2 [IU] via SUBCUTANEOUS
  Administered 2021-01-14: 1 [IU] via SUBCUTANEOUS
  Administered 2021-01-15: 2 [IU] via SUBCUTANEOUS

## 2021-01-13 MED ORDER — LORAZEPAM 0.5 MG PO TABS
0.5000 mg | ORAL_TABLET | Freq: Two times a day (BID) | ORAL | Status: DC | PRN
  Administered 2021-01-13 – 2021-01-14 (×3): 0.5 mg via ORAL
  Filled 2021-01-13 (×3): qty 1

## 2021-01-13 MED FILL — Mannitol IV Soln 20%: INTRAVENOUS | Qty: 500 | Status: AC

## 2021-01-13 MED FILL — Electrolyte-R (PH 7.4) Solution: INTRAVENOUS | Qty: 7000 | Status: AC

## 2021-01-13 MED FILL — Heparin Sodium (Porcine) Inj 1000 Unit/ML: INTRAMUSCULAR | Qty: 20 | Status: AC

## 2021-01-13 MED FILL — Magnesium Sulfate Inj 50%: INTRAMUSCULAR | Qty: 10 | Status: AC

## 2021-01-13 MED FILL — Heparin Sodium (Porcine) Inj 1000 Unit/ML: INTRAMUSCULAR | Qty: 30 | Status: AC

## 2021-01-13 MED FILL — Sodium Chloride IV Soln 0.9%: INTRAVENOUS | Qty: 4000 | Status: AC

## 2021-01-13 MED FILL — Calcium Chloride Inj 10%: INTRAVENOUS | Qty: 10 | Status: AC

## 2021-01-13 MED FILL — Albumin, Human Inj 5%: INTRAVENOUS | Qty: 250 | Status: AC

## 2021-01-13 MED FILL — Sodium Bicarbonate IV Soln 8.4%: INTRAVENOUS | Qty: 50 | Status: AC

## 2021-01-13 MED FILL — Potassium Chloride Inj 2 mEq/ML: INTRAVENOUS | Qty: 40 | Status: AC

## 2021-01-13 MED FILL — Lidocaine HCl (Cardiac) IV PF Soln 100 MG/5ML (2%): INTRAVENOUS | Qty: 5 | Status: AC

## 2021-01-13 NOTE — Progress Notes (Addendum)
      [address]       [place] [address]             [phone]      3 Days Post-Op Procedure(s) (LRB): CORONARY ARTERY BYPASS GRAFTING (CABG) TIMES FOUR, ON PUMP, USING LEFT INTERNAL MAMMARY ARTERY AND ENDOSCOPICALLY HARVESTED LEG VEIN (N/A) TRANSESOPHAGEAL ECHOCARDIOGRAM (TEE) (N/A) MITRAL VALVE REPAIR (MVR) USING MEDTRONIC SIMUFORM 32MM RING ENDOVEIN HARVEST OF GREATER SAPHENOUS VEIN (Right) CHEST EXPLORATION FOR RETAINED FOREIGN OBJECT  Subjective:  Patient states he had a rough night.  He would alternate between being "roasting" and then freezing cold.  He states he had to get up to go to the bathroom.  Objective: Vital signs in last 24 hours: Temp:  [97.7 F (36.5 C)-98 F (36.7 C)] 97.8 F (36.6 C) (06/16 0242) Pulse Rate:  [74-97] 87 (06/16 0242) Cardiac Rhythm: Normal sinus rhythm;Bundle branch block (06/15 2003) Resp:  [8-25] 18 (06/16 0242) BP: (98-127)/(63-71) 127/71 (06/16 0242) SpO2:  [95 %-100 %] 100 % (06/16 0242) Arterial Line BP: (137-163)/(68-72) 163/72 (06/15 0845) Weight:  [97.8 kg] 97.8 kg (06/16 0242)  Intake/Output from previous day: 06/15 0701 - 06/16 0700 In: 243.4 [P.O.:220; I.V.:23.4] Out: 1225 [Urine:1225] Intake/Output this shift:  General appearance: alert, cooperative, and no distress Heart: regular rate and rhythm Lungs: clear to auscultation bilaterally Abdomen: soft, non-tender; bowel sounds normal; no masses,  no organomegaly Extremities: edema trace Wound: clean and dry  Lab Results: Recent Labs    01/12/21 0458 01/13/21 0030  WBC 7.9 9.2  HGB 8.2* 7.8*  HCT 24.5* 23.4*  PLT 73* 77*   BMET:  Recent Labs    01/12/21 0458 01/13/21 0030  NA 139 138  K 3.9 3.5  CL 108 106  CO2 24 26  GLUCOSE 109* 60*  BUN 21 20  CREATININE 0.94 0.79  CALCIUM 7.3* 7.6*    PT/INR:  Recent Labs    01/13/21 0030  LABPROT 16.6*  INR 1.3*   ABG    Component Value Date/Time   PHART 7.346 (L) 01/11/2021 0057    HCO3 20.8 01/11/2021 0057   TCO2 22 01/11/2021 0057   ACIDBASEDEF 4.0 (H) 01/11/2021 0057   O2SAT 98.0 01/11/2021 0057   CBG (last 3)  Recent Labs    01/12/21 1644 01/12/21 2013 01/13/21 0639  GLUCAP 148* 97 59*    Assessment/Plan: S/P Procedure(s) (LRB): CORONARY ARTERY BYPASS GRAFTING (CABG) TIMES FOUR, ON PUMP, USING LEFT INTERNAL MAMMARY ARTERY AND ENDOSCOPICALLY HARVESTED LEG VEIN (N/A) TRANSESOPHAGEAL ECHOCARDIOGRAM (TEE) (N/A) MITRAL VALVE REPAIR (MVR) USING MEDTRONIC SIMUFORM 32MM RING ENDOVEIN HARVEST OF GREATER SAPHENOUS VEIN (Right) CHEST EXPLORATION FOR RETAINED FOREIGN OBJECT  CV- NSR, BP stable- will start low dose Coreg, will d/c EPW INR 1.3, coumadin at 2.5 mg daily, if no increase will increase dose tomorrow Pulm- no acute issues, continue IS Renal- creatinine WNL, weight is elevated continue Lasix, K is at 3.5 supplement increased Expected post operative blood loss anemia, Hgb at 7.8 monitor DM- hypoglycemia at times, will decrease SSIP, Dispo- patient stable, in NSR will add low dose Coreg, d/c EPW, continue coumadin at 2.5 mg daily, diurese, increase potassium supplement, adjust SSIP for hypoglycemia, continue current care   LOS: 3 days    Ellwood Handler, PA-C 01/13/2021 Patient seen and examined, agree with above Hypoglycemia- stop levemir   Remo Lipps C. Roxan Hockey, MD [facility] [phone]

## 2021-01-13 NOTE — Progress Notes (Signed)
Epicardial pacing wires removed. No bleeding. All ends intact. Pt tolerated well and VSS. On bedrest for one hour.

## 2021-01-13 NOTE — Progress Notes (Signed)
CARDIAC REHAB PHASE I   Offered to walk with pt. Pt declining at this time due to just eating. Stressed importance of ambulating 3x/d. Pt requesting front wheel walker and 3-in-1 for home use, CM made aware. Reviewed importance of staying in the tube. Will continue to follow.  [phone] Rufina Falco, RN BSN 01/13/2021 2:24 PM

## 2021-01-13 NOTE — Progress Notes (Signed)
Inpatient Diabetes Program Recommendations  AACE/ADA: New Consensus Statement on Inpatient Glycemic Control (2015)  Target Ranges:  Prepandial:   less than 140 mg/dL      Peak postprandial:   less than 180 mg/dL (1-2 hours)      Critically ill patients:  140 - 180 mg/dL   Lab Results  Component Value Date   GLUCAP 96 01/13/2021   HGBA1C 8.5 (H) 01/07/2021    Review of Glycemic Control Results for Jonathan [name], Jonathan [name] (MRN [id number]) as of 01/13/2021 12:53  Ref. Range 01/12/2021 16:44 01/12/2021 20:13 01/13/2021 06:39 01/13/2021 11:16  Glucose-Capillary Latest Ref Range: 70 - 99 mg/dL 148 (H) 97 59 (L) 96   Diabetes history: Type 2 DM Outpatient Diabetes medications: Metformin 500 mg BID, Farxiga 10 mg QAM Current orders for Inpatient glycemic control: Metformin 500 mg BID, Novolog 0-9 units TID, Novolog 0-5 units QHS  Inpatient Diabetes Program Recommendations:    Noted hypoglycemia of 59 mg/dL and subsequent insulin adjustment.  Will follow.   Thanks, Bronson Curb, MSN, RNC-OB Diabetes Coordinator [phone] (8a-5p)

## 2021-01-13 NOTE — Progress Notes (Signed)
Pull of pacing wires

## 2021-01-13 NOTE — Discharge Summary (Addendum)
[address]       ,[place] [address]             [phone]    Physician Discharge Summary  Patient ID: Jonathan [name]. MRN: [id number] DOB/AGE: [birth year] 70 y.o.  Admit date: 01/10/2021 Discharge date: 01/15/2021   Admission Diagnoses:  Acute diastolic heart failure  Patient Active Problem List    Date Noted   CAD (coronary artery disease), native coronary artery 72/53/6644   Acute systolic CHF (congestive heart failure) ([place]) 12/30/2020   Poorly controlled type 2 diabetes mellitus ([place]) 12/30/2020   HTN (hypertension) 12/30/2020   Hyperlipidemia 12/30/2020    Discharge Diagnoses:  Acute diastolic heart failure due to 3 vessel coronary disease and severe mitral regurgitation  Patient Active Problem List   Diagnosis Date Noted   S/P CABG x 4 01/10/2021   S/P MVR (mitral valve repair) 01/10/2021   CAD (coronary artery disease), native coronary artery 03/47/4259   Acute systolic CHF (congestive heart failure) ([place]) 12/30/2020   Poorly controlled type 2 diabetes mellitus ([place]) 12/30/2020   HTN (hypertension) 12/30/2020   Hyperlipidemia 12/30/2020   Discharged Condition: good  History of Present Illness:  Jonathan, [name] is a 70 year old man with multiple cardiac risk factors including hypertension, hyperlipidemia, type 2 diabetes, and tobacco abuse.  He quit smoking 30 years ago.  He recently presented to [facility] with shortness of breath.  He was diagnosed with acute diastolic congestive heart failure.  He was diuresed and improved clinically.  He was discharged and followed up with Dr. Peter [name].  Dr. Martinique did a right and left heart catheterization on 12/30/2020.  It revealed three-vessel coronary disease with preserved systolic function and normal LVEDP and right-sided heart pressures.  He was referred to [facility] for surgical evaluation.  He was evaluated by Dr. Roxan [name] at which time the patient stated he had  been having indigestion all the time.  The symptoms worsen with exertion.  He also complains of fatigue.  Dr. Roxan [name] recommended the patient should undergo Coronary bypass grafting.  The risks and benefits of the procedure were explained to the patient and he was agreeable to proceed.  Hospital Course:  Mr. Jonathan [name] presented to [facility] on 01/10/2021.  He was taken to the operating room for surgery.  TEE obtained preoperatively showed severe Mitral Regurgitation.  It was felt the patient would require intervention on his Mitral Valve as well.  The patient underwent CABG x 4 utilizing LIMA to LAD, SVG to OM, SVG to Distal RCA, and SVG to Diagonal.  He also underwent Mitral Valve Repair with a 32 mm Edwards Simuform Ring and endoscopic harvest of the greater saphenous vein from his right leg.  He tolerated the procedure without difficulty and was taken to the SICU in stable condition.  The patient was extubated the evening of surgery.  During his stay in the SICU the patient was weaned off Neo synephrine, Milrinone, and Epinephrine as hemodynamics allow.  The patients chest tube exhibited a small air leak.  CXR showed no evidence of pneumothorax.  There as atelectasis bilaterally. This resolved and his chest tubes were able to be removed on 01/12/2021.  Follow up CXR showed no pneumothorax or significant pleural effusion.   The patient complained of numbness in his right 4th and 5th finger, which as attributed to mild brachial plexopathy.  The patient was initiated on low dose coumadin for his MV Repair.  This will be continued for 6 weeks time.  He was started on IV diuretics for volume overloaded state.  His post operative thrombocytopenia worsened and his Lovenox was discontinued.  He was maintaining NSR and was transferred to the progressive care unit on 01/12/2021.  He was started on low dose Coreg.  This was titrated for tachycardia and PVCs.  He continued to make progress.  His pacing wires were  removed without difficulty.  He remains on coumadin for his MV Repair.  His most recent INR is 1.5 and he will be discharged home on 5 mg of coumadin.  His surgical incisions are healing without evidence of infection.  He is ambulating independently.  He is felt medically stable for discharge home today.     Significant Diagnostic Studies: angiography:    Ost LM lesion is 35% stenosed. Prox LAD lesion is 90% stenosed. Mid LAD lesion is 70% stenosed. 1st Diag lesion is 50% stenosed. Prox Cx to Mid Cx lesion is 99% stenosed. Prox RCA lesion is 70% stenosed. LV end diastolic pressure is normal.   1. Severe 3 vessel obstructive CAD 2. Normal LV filling pressures 3. Normal right heart pressures. 4. Normal cardiac output   Plan: recommend referral to CT surgery for CABG. He has good targets. Given LV dysfunction, DM, and multivessel CAD his best option is CABG.  Intra-operative TEE  Mitral Valve: The mitral valve is normal in structure. Mitral valve  regurgitation is severe by color flow Doppler. The MR jet is  posteriorly-directed. Very eccentric jet. Intermittent flow reversal in  right pulmonary vein. Anterior leaflet very slightly  overriding the anterior leaflet, and anterior leaflet appears thethered.  Discussed with Dr. Roxan [name] and had Dr. Jenita Seashore evaluate as well  Treatments: surgery:   Operative Report   DATE OF PROCEDURE: 01/10/2021   PREOPERATIVE DIAGNOSIS:  Three-vessel coronary artery disease.   POSTOPERATIVE DIAGNOSES:  Three-vessel coronary artery disease and severe mitral regurgitation.   PROCEDURES PERFORMED: 1.  Median sternotomy, extracorporeal circulation, coronary artery bypass grafting x4 (left internal mammary artery to left anterior descending, saphenous vein graft to first diagonal, saphenous vein graft to the obtuse marginal 1, and saphenous vein graft to distal right coronary), endoscopic vein harvest right leg. 2.  Mitral valve repair using 32 mm  Medtronic SimuForm ring annuloplasty.   SURGEON:  Modesto Charon, MD   ASSISTANT:  Ellwood Handler, PA-C   Discharge Exam: Blood pressure (!) 97/56, pulse 80, temperature 97.9 F (36.6 C), temperature source Oral, resp. rate 19, height 5\' 9"  (1.753 m), weight 91.6 kg, SpO2 98 %.  General appearance: alert, cooperative, and no distress Heart: regular rate and rhythm, S1, S2 normal, no murmur, click, rub or gallop Lungs: clear to auscultation bilaterally Abdomen: soft, non-tender; bowel sounds normal; no masses,  no organomegaly Extremities: extremities normal, atraumatic, no cyanosis or edema Wound: clean and dry   Discharge Medications:  The patient has been discharged on:   1.Beta Blocker:  Yes [  X ]                              No   [   ]                              If No, reason:  2.Ace Inhibitor/ARB: Yes [   ]  No  [ X   ]                                     If No, reason: BP low  3.Statin:   Yes [ X  ]                  No  [   ]                  If No, reason:  4.Ecasa:  Yes  [  X ]                  No   [   ]                  If No, reason:     Allergies as of 01/15/2021   No Known Allergies      Medication List     STOP taking these medications    Entresto 24-26 MG Generic drug: sacubitril-valsartan       TAKE these medications    acetaminophen 500 MG tablet Commonly known as: TYLENOL Take 2 tablets (1,000 mg total) by mouth every 6 (six) hours.   albuterol 108 (90 Base) MCG/ACT inhaler Commonly known as: VENTOLIN HFA Inhale 2 puffs into the lungs every 4 (four) hours as needed for shortness of breath or wheezing.   aspirin 81 MG EC tablet Take 1 tablet (81 mg total) by mouth daily. Swallow whole.   carvedilol 6.25 MG tablet Commonly known as: COREG Take 1 tablet (6.25 mg total) by mouth 2 (two) times daily with a meal.   cholecalciferol 25 MCG (1000 UNIT) tablet Commonly known as: VITAMIN  D3 Take 1,000 Units by mouth daily.   dapagliflozin propanediol 10 MG Tabs tablet Commonly known as: Farxiga Take 1 tablet (10 mg total) by mouth daily before breakfast.   furosemide 20 MG tablet Commonly known as: LASIX Take 1 tablet (20 mg total) by mouth daily.   LORazepam 0.5 MG tablet Commonly known as: ATIVAN Take 0.5 mg by mouth daily.   metFORMIN 500 MG tablet Commonly known as: GLUCOPHAGE Take 1 tablet (500 mg total) by mouth 2 (two) times daily.   OVER THE COUNTER MEDICATION Take 1 tablet by mouth daily. Immune Plus   oxyCODONE 5 MG immediate release tablet Commonly known as: Oxy IR/ROXICODONE Take 1 tablet (5 mg total) by mouth every 6 (six) hours as needed for severe pain.   rosuvastatin 20 MG tablet Commonly known as: CRESTOR Take 1 tablet (20 mg total) by mouth daily.   Simply Saline 0.9 % Aers Generic drug: Saline Place 1 spray into the nose every morning.   warfarin 5 MG tablet Commonly known as: COUMADIN Take 1 tablet (5 mg total) by mouth daily at 4 PM.               Durable Medical Equipment  (From admission, onward)           Start     Ordered   01/14/21 0728  For home use only DME Walker rolling  Once       Question Answer Comment  Walker: With [place] Wheels   Patient needs a walker to treat with the following condition Physical deconditioning      01/14/21 0727   01/14/21 0728  For home use only DME 3 n 1  Once        01/14/21 7544            Follow-up Information     Melrose Nakayama, MD Follow up on 02/08/2021.   Specialty: Cardiothoracic Surgery Why: Appointment is at 12:30, please get CXR at 12:00 at [place] located on first floor of our office building Contact information: [place] [address] [phone]         Darreld Mclean, PA-C Follow up on 02/17/2021.   Specialties: Physician Assistant, Cardiology Why: Appointment is at [employer] information: [address] [place] [address] [phone]         [facility] Follow up on 01/19/2021.   Specialty: Cardiology Why: Appointment is at 9:30 Contact information: [address] [place], [place] [facility] Follow up.   Why: Rolling walker and 3n1 arranged- to be delivered to room prior to discharge Contact information: [place] [address] [phone]                 Signed: Nicholes Rough, PA-C 01/15/2021, 9:56 AM

## 2021-01-13 NOTE — Progress Notes (Signed)
Mobility Specialist: Progress Note   01/13/21 1550  Mobility  Activity Ambulated in hall  Level of Assistance Contact guard assist, steadying assist  Assistive Device Front wheel walker  Distance Ambulated (ft) 230 ft  Mobility Ambulated with assistance in hallway  Mobility Response Tolerated well  Mobility performed by Mobility specialist  $Mobility charge 1 Mobility   Pre-Mobility: 99 HR, 96% SpO2 Post-Mobility: 101 HR, 102/65 BP, 98% SpO2  Pt informed me he has a bad R knee and that it will sometimes give out on him and buckle, did not experience during ambulation though. Pt visibly SOB during ambulation but otherwise asx. Pt to bed after walk per request with call bell at his side.   [facility] [place]e [place] Mobility Specialist Mobility Specialist Phone: [phone]

## 2021-01-14 LAB — GLUCOSE, CAPILLARY
Glucose-Capillary: 93 mg/dL (ref 70–99)
Glucose-Capillary: 141 mg/dL — ABNORMAL HIGH (ref 70–99)
Glucose-Capillary: 155 mg/dL — ABNORMAL HIGH (ref 70–99)
Glucose-Capillary: 205 mg/dL — ABNORMAL HIGH (ref 70–99)

## 2021-01-14 LAB — PROTIME-INR
INR: 1.3 — ABNORMAL HIGH (ref 0.8–1.2)
Prothrombin Time: 16.3 seconds — ABNORMAL HIGH (ref 11.4–15.2)

## 2021-01-14 MED ORDER — CARVEDILOL 6.25 MG PO TABS
6.2500 mg | ORAL_TABLET | Freq: Two times a day (BID) | ORAL | Status: DC
  Administered 2021-01-14 (×2): 6.25 mg via ORAL
  Filled 2021-01-14 (×3): qty 1

## 2021-01-14 MED ORDER — WARFARIN SODIUM 5 MG PO TABS
5.0000 mg | ORAL_TABLET | Freq: Every day | ORAL | Status: DC
  Administered 2021-01-14: 5 mg via ORAL
  Filled 2021-01-14: qty 1

## 2021-01-14 NOTE — Progress Notes (Signed)
CARDIAC REHAB PHASE I   Offered to walk with pt. Pt states recent ambulation with mobility. D/c education completed with pt. Pt educated on importance of site care and monitoring incisions daily. Encouraged continued IS use, walks, and sternal precautions. Pt given in-the-tube sheet along with heart healthy and diabetic diets. Reviewed restrictions and exercise guidelines. Will refer to CRP II [place].  [id number] Rufina Falco, RN BSN 01/14/2021 2:08 PM

## 2021-01-14 NOTE — Care Management Important Message (Signed)
Important Message  Patient Details  Name: Jonathan [name]. MRN: [id number] Date of Birth: 08-20-1950   Medicare Important Message Given:  Yes     Venson Ferencz 01/14/2021, 8:34 AM

## 2021-01-14 NOTE — Progress Notes (Addendum)
      [address]       [place] [address]             [phone]      4 Days Post-Op Procedure(s) (LRB): CORONARY ARTERY BYPASS GRAFTING (CABG) TIMES FOUR, ON PUMP, USING LEFT INTERNAL MAMMARY ARTERY AND ENDOSCOPICALLY HARVESTED LEG VEIN (N/A) TRANSESOPHAGEAL ECHOCARDIOGRAM (TEE) (N/A) MITRAL VALVE REPAIR (MVR) USING MEDTRONIC SIMUFORM 32MM RING ENDOVEIN HARVEST OF GREATER SAPHENOUS VEIN (Right) CHEST EXPLORATION FOR RETAINED FOREIGN OBJECT  Subjective:  Patient doing all right.  He states he again didn't rest well last night.  States he gets cold in here at night.  He is hoping to go home.  He has ambulated  + BM  Objective: Vital signs in last 24 hours: Temp:  [97.7 F (36.5 C)-98.1 F (36.7 C)] 97.7 F (36.5 C) (06/17 0336) Pulse Rate:  [97-100] 98 (06/17 0336) Cardiac Rhythm: Normal sinus rhythm (06/16 2350) Resp:  [17-20] 17 (06/17 0336) BP: (102-121)/(65-81) 119/77 (06/17 0336) SpO2:  [96 %-98 %] 98 % (06/17 0336) Weight:  [93.7 kg] 93.7 kg (06/17 0336)  Intake/Output from previous day: 06/16 0701 - 06/17 0700 In: 240 [P.O.:240] Out: 1450 [Urine:1450]   General appearance: alert, cooperative, and no distress Heart: regular rate and rhythm Lungs: clear to auscultation bilaterally Abdomen: soft, non-tender; bowel sounds normal; no masses,  no organomegaly Extremities: edema trace Wound: clean and dry  Lab Results: Recent Labs    01/12/21 0458 01/13/21 0030  WBC 7.9 9.2  HGB 8.2* 7.8*  HCT 24.5* 23.4*  PLT 73* 77*   BMET:  Recent Labs    01/12/21 0458 01/13/21 0030  NA 139 138  K 3.9 3.5  CL 108 106  CO2 24 26  GLUCOSE 109* 60*  BUN 21 20  CREATININE 0.94 0.79  CALCIUM 7.3* 7.6*    PT/INR:  Recent Labs    01/14/21 0207  LABPROT 16.3*  INR 1.3*   ABG    Component Value Date/Time   PHART 7.346 (L) 01/11/2021 0057   HCO3 20.8 01/11/2021 0057   TCO2 22 01/11/2021 0057   ACIDBASEDEF 4.0 (H) 01/11/2021 0057   O2SAT  98.0 01/11/2021 0057   CBG (last 3)  Recent Labs    01/13/21 1628 01/13/21 2104 01/14/21 0616  GLUCAP 135* 87 93    Assessment/Plan: S/P Procedure(s) (LRB): CORONARY ARTERY BYPASS GRAFTING (CABG) TIMES FOUR, ON PUMP, USING LEFT INTERNAL MAMMARY ARTERY AND ENDOSCOPICALLY HARVESTED LEG VEIN (N/A) TRANSESOPHAGEAL ECHOCARDIOGRAM (TEE) (N/A) MITRAL VALVE REPAIR (MVR) USING MEDTRONIC SIMUFORM 32MM RING ENDOVEIN HARVEST OF GREATER SAPHENOUS VEIN (Right) CHEST EXPLORATION FOR RETAINED FOREIGN OBJECT  CV- Mild Sinus Tach with occasional PVC- will increase Coreg to 6.25 mg daily INR 1.3, no response after 2.5 mg, will increase to 5 mg daily Pulm- no acute issues, not requiring oxygen, continue IS Renal- creatinine has been stable, weight is trending down, continue Lasix, potassium DM-hypoglycemia improved with reduction in SSIP, continue Metformin Dispo- patient stable, will increase Coreg for additional HR control, increase coumadin for MV Repair, continue diuretics, will observe today with increase in BB and plan for d/c in AM if patient remains stable   LOS: 4 days    Ellwood Handler, PA-C 01/14/2021  Patient seen and examined, agree with above He is very anxious to go home Will up beta blocker and coumadin Probably home in AM  [place] C. Roxan Hockey, MD [facility] [phone]

## 2021-01-14 NOTE — Progress Notes (Signed)
Mobility Specialist: Progress Note   01/14/21 1101  Mobility  Activity Ambulated in hall  Level of Assistance Contact guard assist, steadying assist  Assistive Device Front wheel walker  Distance Ambulated (ft) 270 ft  Mobility Ambulated with assistance in hallway  Mobility Response Tolerated well  Mobility performed by Mobility specialist  $Mobility charge 1 Mobility   Pre-Mobility: 92 HR During Mobility: 90 HR, 100% SpO2 Post-Mobility: 105 HR, 115/66 BP, 99% SpO2  Pt visibly SOB during ambulation, otherwise asx. Pt to BR to void and then back to bed after walk with family members present in the room.   [facility] Tatelyn Vanhecke Mobility Specialist Mobility Specialist Phone: [phone]

## 2021-01-14 NOTE — Plan of Care (Signed)
  Problem: Education: Goal: Will demonstrate proper wound care and an understanding of methods to prevent future damage Outcome: Progressing Goal: Knowledge of disease or condition will improve Outcome: Progressing Goal: Knowledge of the prescribed therapeutic regimen will improve Outcome: Progressing Goal: Individualized Educational Video(s) Outcome: Progressing

## 2021-01-14 NOTE — Progress Notes (Signed)
Pt requested to not be disturbed from 9p-5a.  During 11p rounds, pt is sleeping comfortably. HR NSR 70s.  Will defer 12am vitals and re-attempt at 4-5am. Will continue to monitor. Jaymes Graff, RN

## 2021-01-15 LAB — PROTIME-INR
INR: 1.5 — ABNORMAL HIGH (ref 0.8–1.2)
Prothrombin Time: 17.7 seconds — ABNORMAL HIGH (ref 11.4–15.2)

## 2021-01-15 LAB — GLUCOSE, CAPILLARY: Glucose-Capillary: 151 mg/dL — ABNORMAL HIGH (ref 70–99)

## 2021-01-15 MED ORDER — ACETAMINOPHEN 500 MG PO TABS: 1000.0000 mg | ORAL_TABLET | Freq: Four times a day (QID) | ORAL | 0 refills | Status: DC

## 2021-01-15 MED ORDER — WARFARIN SODIUM 5 MG PO TABS: 5.0000 mg | ORAL_TABLET | Freq: Every day | ORAL | 1 refills | Status: DC

## 2021-01-15 MED ORDER — OXYCODONE HCL 5 MG PO TABS: 5.0000 mg | ORAL_TABLET | Freq: Four times a day (QID) | ORAL | 0 refills | Status: DC | PRN

## 2021-01-15 MED ORDER — ASPIRIN 81 MG PO TBEC: 81.0000 mg | DELAYED_RELEASE_TABLET | Freq: Every day | ORAL | 11 refills | Status: DC

## 2021-01-15 MED ORDER — CARVEDILOL 6.25 MG PO TABS: 6.2500 mg | ORAL_TABLET | Freq: Two times a day (BID) | ORAL | 1 refills | Status: DC

## 2021-01-15 NOTE — Progress Notes (Signed)
      [address]       [place] [address]             [phone]      5 Days Post-Op Procedure(s) (LRB): CORONARY ARTERY BYPASS GRAFTING (CABG) TIMES FOUR, ON PUMP, USING LEFT INTERNAL MAMMARY ARTERY AND ENDOSCOPICALLY HARVESTED LEG VEIN (N/A) TRANSESOPHAGEAL ECHOCARDIOGRAM (TEE) (N/A) MITRAL VALVE REPAIR (MVR) USING MEDTRONIC SIMUFORM 32MM RING ENDOVEIN HARVEST OF GREATER SAPHENOUS VEIN (Right) CHEST EXPLORATION FOR RETAINED FOREIGN OBJECT  Subjective: Feels good this morning, he did vomit some sprite but it was due to his hiatal hernia and he states it happens all the time with some beverages. Pain is well controlled  Objective: Vital signs in last 24 hours: Temp:  [97.6 F (36.4 C)-98.2 F (36.8 C)] 97.9 F (36.6 C) (06/18 0300) Pulse Rate:  [80-99] 80 (06/18 0255) Cardiac Rhythm: Normal sinus rhythm (06/18 0811) Resp:  [16-20] 19 (06/18 0300) BP: (92-119)/(56-89) 97/56 (06/18 0300) SpO2:  [97 %-100 %] 98 % (06/18 0300) Weight:  [91.6 kg] 91.6 kg (06/18 0255)    Intake/Output from previous day: No intake/output data recorded. Intake/Output this shift: No intake/output data recorded.  General appearance: alert, cooperative, and no distress Heart: regular rate and rhythm, S1, S2 normal, no murmur, click, rub or gallop Lungs: clear to auscultation bilaterally Abdomen: soft, non-tender; bowel sounds normal; no masses,  no organomegaly Extremities: extremities normal, atraumatic, no cyanosis or edema Wound: clean and dry  Lab Results: Recent Labs    01/13/21 0030  WBC 9.2  HGB 7.8*  HCT 23.4*  PLT 77*   BMET:  Recent Labs    01/13/21 0030  NA 138  K 3.5  CL 106  CO2 26  GLUCOSE 60*  BUN 20  CREATININE 0.79  CALCIUM 7.6*    PT/INR:  Recent Labs    01/15/21 0122  LABPROT 17.7*  INR 1.5*   ABG    Component Value Date/Time   PHART 7.346 (L) 01/11/2021 0057   HCO3 20.8 01/11/2021 0057   TCO2 22 01/11/2021 0057   ACIDBASEDEF  4.0 (H) 01/11/2021 0057   O2SAT 98.0 01/11/2021 0057   CBG (last 3)  Recent Labs    01/14/21 1723 01/14/21 2133 01/15/21 0615  GLUCAP 155* 205* 151*    Assessment/Plan: S/P Procedure(s) (LRB): CORONARY ARTERY BYPASS GRAFTING (CABG) TIMES FOUR, ON PUMP, USING LEFT INTERNAL MAMMARY ARTERY AND ENDOSCOPICALLY HARVESTED LEG VEIN (N/A) TRANSESOPHAGEAL ECHOCARDIOGRAM (TEE) (N/A) MITRAL VALVE REPAIR (MVR) USING MEDTRONIC SIMUFORM 32MM RING ENDOVEIN HARVEST OF GREATER SAPHENOUS VEIN (Right) CHEST EXPLORATION FOR RETAINED FOREIGN OBJECT  CV- NSR in the 80s, BP 97/56 MAP of 69 - on Coreg 6.25 mg daily INR 1.5, continue 5 mg coumadin daily for MV repair Pulm- no acute issues, not requiring oxygen, continue IS Renal- no new labs since 6/16 but creatine stable at that time DM-Blood glucose better controlled with deduction of insulin   Plan: Discharge instructions reviewed with the patient. He understands he cannot get into a pool for several weeks until his incisions are healed. He does have help at home with family members living close by.    LOS: 5 days    Elgie Collard 01/15/2021

## 2021-01-15 NOTE — Progress Notes (Signed)
Discharged to home with family office visits in place teaching done  

## 2021-01-17 ENCOUNTER — Ambulatory Visit: Payer: Medicare Other | Admitting: Cardiology

## 2021-01-19 ENCOUNTER — Other Ambulatory Visit: Payer: Self-pay

## 2021-01-19 ENCOUNTER — Ambulatory Visit (INDEPENDENT_AMBULATORY_CARE_PROVIDER_SITE_OTHER): Payer: Medicare Other | Admitting: Pharmacist Clinician (PhC)/ Clinical Pharmacy Specialist

## 2021-01-19 VITALS — BP 98/62 | HR 73 | Resp 16 | Ht 69.0 in | Wt 196.6 lb

## 2021-01-19 DIAGNOSIS — Z9889 Other specified postprocedural states: Secondary | ICD-10-CM

## 2021-01-19 DIAGNOSIS — Z7901 Long term (current) use of anticoagulants: Secondary | ICD-10-CM

## 2021-01-19 LAB — POCT INR: INR: 3.2 — AB (ref 2.0–3.0)

## 2021-01-19 NOTE — Progress Notes (Signed)
A full discussion of the nature of anticoagulants has been carried out.  A benefit risk analysis has been presented to the patient, so that they understand the justification for choosing anticoagulation at this time. The need for frequent and regular monitoring, precise dosage adjustment and compliance is stressed.  Side effects of potential bleeding are discussed.  The patient should avoid any OTC items containing aspirin or ibuprofen, and should avoid great swings in general diet.  Avoid alcohol consumption.  Call if any signs of abnormal bleeding.  Next PT/INR test in 1 week at [facility] office.

## 2021-01-19 NOTE — Progress Notes (Signed)
     Patient was originally scheduled for HF medication titration at this visit.  Unfortunately since being scheduled, he was admitted to [facility] on June 13 for CABG x 4.  Delene Loll was discontinued in hospital because of low blood pressure.  Blood pressure today in office still low.  Patient not feeling symptomatic hypotension.  Will not consider starting Entresto at this time, but will wait for his pressure to increase.  He is scheduled to meet with surgeon as well as PA in this office in July.  We can see him at some time after that should he be a candidate for re-starting Entresto.  Of note, patient was discharged on warfarin 5 mg daily with no scheduled follow up.  After explaining purpose of original appointment, explained that instead, we will check INR and do new coumadin visit information.  See anticoagulation encounter for same date.

## 2021-01-19 NOTE — Patient Instructions (Signed)
Take warfarin according to calendar provided.  Be cautious with leafy green vegetables and cranberries for the next 6 weeks.  Try to avoid ibuprofen or naproxen (Aleve).  Use acteaminophen (Tylenol) instead, but limit to no more than 6 tablets in a day.  You can use topical products such as Icy Hot or Voltaren gel  You will see Lattie Haw at the [place] office coumadin clinic for your follow up visits to check INR level.  Please feel free to call with any questions or concerns - Tommy Medal PharmD [phone]

## 2021-01-26 ENCOUNTER — Ambulatory Visit (INDEPENDENT_AMBULATORY_CARE_PROVIDER_SITE_OTHER): Payer: Medicare Other | Admitting: *Deleted

## 2021-01-26 DIAGNOSIS — Z5181 Encounter for therapeutic drug level monitoring: Secondary | ICD-10-CM

## 2021-01-26 DIAGNOSIS — Z9889 Other specified postprocedural states: Secondary | ICD-10-CM

## 2021-01-26 LAB — POCT INR: INR: 1.7 — AB (ref 2.0–3.0)

## 2021-01-26 NOTE — Patient Instructions (Signed)
Has been taking 1 tablet daily except 1/2 tablet on Sundays and Wednesdays Increase warfarin back to 1 tablet (5mg ) daily Recheck in 1 week

## 2021-01-28 DIAGNOSIS — Z952 Presence of prosthetic heart valve: Secondary | ICD-10-CM | POA: Diagnosis not present

## 2021-01-28 DIAGNOSIS — I509 Heart failure, unspecified: Secondary | ICD-10-CM | POA: Diagnosis not present

## 2021-01-28 DIAGNOSIS — I10 Essential (primary) hypertension: Secondary | ICD-10-CM | POA: Diagnosis not present

## 2021-01-28 DIAGNOSIS — R202 Paresthesia of skin: Secondary | ICD-10-CM | POA: Diagnosis not present

## 2021-01-28 DIAGNOSIS — Z951 Presence of aortocoronary bypass graft: Secondary | ICD-10-CM | POA: Diagnosis not present

## 2021-01-28 DIAGNOSIS — I340 Nonrheumatic mitral (valve) insufficiency: Secondary | ICD-10-CM | POA: Diagnosis not present

## 2021-02-02 ENCOUNTER — Ambulatory Visit (INDEPENDENT_AMBULATORY_CARE_PROVIDER_SITE_OTHER): Payer: Medicare Other | Admitting: *Deleted

## 2021-02-02 ENCOUNTER — Other Ambulatory Visit: Payer: Self-pay

## 2021-02-02 DIAGNOSIS — Z5181 Encounter for therapeutic drug level monitoring: Secondary | ICD-10-CM

## 2021-02-02 DIAGNOSIS — Z9889 Other specified postprocedural states: Secondary | ICD-10-CM | POA: Diagnosis not present

## 2021-02-02 LAB — POCT INR: INR: 1.5 — AB (ref 2.0–3.0)

## 2021-02-02 NOTE — Patient Instructions (Signed)
Take warfarin 1 1/2 tablets tonight then increase dose to 1 tablet daily except 1 1/2 tablets on Sundays and Thursdays Recheck in 1 week

## 2021-02-04 DIAGNOSIS — R202 Paresthesia of skin: Secondary | ICD-10-CM | POA: Diagnosis not present

## 2021-02-04 DIAGNOSIS — I959 Hypotension, unspecified: Secondary | ICD-10-CM | POA: Diagnosis not present

## 2021-02-07 ENCOUNTER — Other Ambulatory Visit: Payer: Self-pay | Admitting: Thoracic Surgery (Cardiothoracic Vascular Surgery)

## 2021-02-07 ENCOUNTER — Ambulatory Visit (INDEPENDENT_AMBULATORY_CARE_PROVIDER_SITE_OTHER): Payer: Medicare Other | Admitting: *Deleted

## 2021-02-07 ENCOUNTER — Other Ambulatory Visit: Payer: Self-pay

## 2021-02-07 DIAGNOSIS — Z951 Presence of aortocoronary bypass graft: Secondary | ICD-10-CM

## 2021-02-07 DIAGNOSIS — Z9889 Other specified postprocedural states: Secondary | ICD-10-CM

## 2021-02-07 DIAGNOSIS — Z5181 Encounter for therapeutic drug level monitoring: Secondary | ICD-10-CM

## 2021-02-07 LAB — POCT INR: INR: 3.2 — AB (ref 2.0–3.0)

## 2021-02-07 NOTE — Patient Instructions (Signed)
Hold warfarin today then decrease dose to 1 tablet daily except 1 1/2 tablets on Sundays Recheck in 10 days

## 2021-02-08 ENCOUNTER — Ambulatory Visit
Admission: RE | Admit: 2021-02-08 | Discharge: 2021-02-08 | Disposition: A | Discharge status: Home / Self Care | Payer: Medicare Other | Source: Ambulatory Visit | Attending: Thoracic Surgery (Cardiothoracic Vascular Surgery) | Admitting: Thoracic Surgery (Cardiothoracic Vascular Surgery)

## 2021-02-08 ENCOUNTER — Ambulatory Visit (INDEPENDENT_AMBULATORY_CARE_PROVIDER_SITE_OTHER): Payer: Self-pay | Admitting: Physician Assistant

## 2021-02-08 VITALS — BP 97/65 | HR 100 | Resp 20 | Ht 69.0 in | Wt 190.0 lb

## 2021-02-08 DIAGNOSIS — I2510 Atherosclerotic heart disease of native coronary artery without angina pectoris: Secondary | ICD-10-CM

## 2021-02-08 DIAGNOSIS — G540 Brachial plexus disorders: Secondary | ICD-10-CM

## 2021-02-08 DIAGNOSIS — J90 Pleural effusion, not elsewhere classified: Secondary | ICD-10-CM | POA: Diagnosis not present

## 2021-02-08 DIAGNOSIS — Z951 Presence of aortocoronary bypass graft: Secondary | ICD-10-CM | POA: Diagnosis not present

## 2021-02-08 DIAGNOSIS — Z952 Presence of prosthetic heart valve: Secondary | ICD-10-CM | POA: Diagnosis not present

## 2021-02-08 MED ORDER — OXYCODONE HCL 5 MG PO TABS: 5.0000 mg | ORAL_TABLET | Freq: Every evening | ORAL | 0 refills | Status: DC | PRN

## 2021-02-08 NOTE — Progress Notes (Signed)
HPI:  Jonathan [name] is a 70 year old male with past history of diastolic heart failure, poorly controlled diabetes mellitus, hypertension, and dyslipidemia.  He was recently being seen for elective follow-up by Dr. Martinique and at that point left heart catheterization was recommended.  The study was carried out on 12/30/2020 and demonstrated severe three-vessel coronary artery disease with preserved left ventricular function.  He was referred to our service and ultimately coronary bypass grafting was recommended.  He was scheduled for elective surgery on 01/10/2021.  Transesophageal echo obtained immediately after induction of anesthesia demonstrated severe mitral insufficiency.  Dr. Roxan Hockey proceeded with coronary bypass grafting x4 and mitral valve repair with a 32 mm Edwards semiformed annuloplasty ring.  His postoperative course was uneventful except for numbness in his right fourth and fifth fingers.  This was felt to be related to brachial plexopathy.  He was started on Coumadin anticoagulation for the mitral valve repair.  He has been followed regularly by the Coumadin clinic.  Last INR yesterday was 3.2  Since hospital discharge the patient reports he is progressing overall with no shortness of breath or recurrent chest pain.  However, he feels he is having worsening pain in his right arm and numbness in his right fourth and fifth fingers has persisted.  He also has some weakness in his right hand.  He relates having trouble sleeping at night due to the pain in his right elbow and forearm.   Current Outpatient Medications  Medication Sig Dispense Refill   acetaminophen (TYLENOL) 500 MG tablet Take 2 tablets (1,000 mg total) by mouth every 6 (six) hours. 30 tablet 0   albuterol (VENTOLIN HFA) 108 (90 Base) MCG/ACT inhaler Inhale 2 puffs into the lungs every 4 (four) hours as needed for shortness of breath or wheezing.     aspirin EC 81 MG EC tablet Take 1 tablet (81 mg total) by mouth daily. Swallow  whole. 30 tablet 11   carvedilol (COREG) 6.25 MG tablet Take 1 tablet (6.25 mg total) by mouth 2 (two) times daily with a meal. (Patient taking differently: Take 6.25 mg by mouth 2 (two) times daily with a meal. Take 1/2 tablet twice daily.  Dose reduced by PCP) 60 tablet 1   cholecalciferol (VITAMIN D3) 25 MCG (1000 UNIT) tablet Take 1,000 Units by mouth daily.     dapagliflozin propanediol (FARXIGA) 10 MG TABS tablet Take 1 tablet (10 mg total) by mouth daily before breakfast. 30 tablet 11   furosemide (LASIX) 20 MG tablet Take 1 tablet (20 mg total) by mouth daily. 90 tablet 3   LORazepam (ATIVAN) 0.5 MG tablet Take 0.5 mg by mouth daily.     metFORMIN (GLUCOPHAGE) 500 MG tablet Take 1 tablet (500 mg total) by mouth 2 (two) times daily.     OVER THE COUNTER MEDICATION Take 1 tablet by mouth daily. Immune Plus     oxyCODONE (OXY IR/ROXICODONE) 5 MG immediate release tablet Take 1 tablet (5 mg total) by mouth every 6 (six) hours as needed for severe pain. 30 tablet 0   rosuvastatin (CRESTOR) 20 MG tablet Take 1 tablet (20 mg total) by mouth daily. 90 tablet 3   Saline (SIMPLY SALINE) 0.9 % AERS Place 1 spray into the nose every morning.     warfarin (COUMADIN) 5 MG tablet Take 1 tablet (5 mg total) by mouth daily at 4 PM. 30 tablet 1   No current facility-administered medications for this visit.    Physical Exam: BP-97/65 Pulse-100 Respirations-20 SPO2-94%  Heart:  in a regular rate and rhythm.  There is no murmur. Chest: Breath sounds are clear to auscultation anterior and posterior.  The sternotomy incision is well-healed.  Sternum is stable.  CXR reviewed.  Extremities: Both hands are warm and well-perfused although there is slight pallor in the right hand.  He has no sensation in his right fourth and fifth fingers.  There is no deformity or tenderness in the forearm or elbow.  Both hands are well perfused with palpable pulses and normal capillary refill. The right lower extremity EVH  incision is ecchymotic and moist.  There is no drainage.  Skin edges are well approximated.  Diagnostic Tests: CLINICAL DATA:  Previous CABG and mitral valve replacement. Follow-up.   EXAM: CHEST - 2 VIEW   COMPARISON:  01/13/2021   FINDINGS: Previous median sternotomy, CABG and mitral valve replacement. Heart size is normal. Mediastinal shadows are otherwise normal. Resolution of pleural effusions. The lungs are clear. The vascularity is normal. No unexpected bone finding.   IMPRESSION: Resolution of previously seen effusions and atelectasis. No active disease presently.     Electronically Signed   By: Nelson Chimes M.D.   On: 02/08/2021 14:49  Impression / Plan:  Jonathan [name] is making satisfactory progress from cardiac standpoint after four-vessel coronary bypass grafting and mitral valve repair.  His primary concern today is the numbness in his right fourth and fifth fingers and pain in his right elbow and forearm that keeps him awake at night.  I remain suspicious that this is related to the brachial plexopathy and explained that he should see some improvement but this may take several weeks to months.  He was prescribed 5 oxycodone tablets few days ago by another provider that he has been using at night, 1 tablet with 2 Tylenol, and this has resulted in him being able to rest much better.  See no reason why he cannot continue to use oxycodone at night in this fashion.  A new prescription is provided. He is otherwise progressing well.  He needs to maintain sternal precautions for another 6 weeks.  He may resume driving when he is not taking any narcotic analgesics.  Follow-up in 1 month and as needed.    Antony Odea, PA-C [facility] [phone]

## 2021-02-08 NOTE — Patient Instructions (Signed)
Continue to observe sternal precautions for another 6 weeks.  He take oxycodone at bedtime with Tylenol 1000 mg as discussed.  Okay to resume driving when not taking oxycodone.

## 2021-02-14 ENCOUNTER — Ambulatory Visit (INDEPENDENT_AMBULATORY_CARE_PROVIDER_SITE_OTHER): Payer: Medicare Other | Admitting: *Deleted

## 2021-02-14 ENCOUNTER — Other Ambulatory Visit: Payer: Self-pay

## 2021-02-14 DIAGNOSIS — Z5181 Encounter for therapeutic drug level monitoring: Secondary | ICD-10-CM | POA: Diagnosis not present

## 2021-02-14 DIAGNOSIS — Z9889 Other specified postprocedural states: Secondary | ICD-10-CM

## 2021-02-14 LAB — POCT INR: INR: 1.2 — AB (ref 2.0–3.0)

## 2021-02-14 NOTE — Patient Instructions (Signed)
Take warfarin 2 tablets tonight, 1 1/2 tablets tomorrow night then resume 1 tablet daily except 1 1/2 tablets on Sundays  Recheck in 10 days Told pt to get pill box and start putting warfarin in it.  He is in agreement.

## 2021-02-15 ENCOUNTER — Other Ambulatory Visit: Payer: Self-pay | Admitting: Physician Assistant

## 2021-02-16 NOTE — Progress Notes (Deleted)
Cardiology Office Note:    Date:  02/16/2021   ID:  Jonathan Route., DOB 07-23-1951, MRN [id number]  PCP:  Carolee Rota, NP Referring MD: Carolee Rota, NP    [facility]  Cardiologist:  Peter Martinique, MD   Reason for visit: F/U CABG  History of Present Illness:    Jonathan Burkhalter. is a 70 y.o. male with a hx of hypertension, hyperlipidemia, type 2 diabetes, and tobacco abuse.  He quit smoking 30 years ago.  He recently presented to [facility] with shortness of breath.  He was diagnosed with acute diastolic congestive heart failure.  He was diuresed and improved clinically.  [facility] 12/30/2020 showed revealed three-vessel coronary disease with preserved systolic function and normal LVEDP and right-sided heart pressures.   The patient underwent CABG x 4 utilizing LIMA to LAD, SVG to OM, SVG to Distal RCA, and SVG to Diagonal.  He also underwent Mitral Valve Repair with a 32 mm Edwards Simuform Ring and endoscopic harvest of the greater saphenous vein from his right leg.  He was started on coumadin x 6 weeks post MV repair.  He underwent IV diuresis postop.  He saw CT surgery 02/08/2021 and complained about numbness in his right fourth and fifth fingers and pain in his right elbow and forearm that keeps him awake at night.  This is thought to be related to the brachial plexopathy which may take several weeks to months.  He was given Rx for oxycodone at night PRN by CT surgery who will see him back in 1 month.  ***  CAD s/p CABG - CABG x 4 on 01/10/21 - LIMA to LAD, SVG to OM, SVG to Distal RCA, and SVG to Diagonal.   Mitral regurgitation s/p  Mitral Valve Repair  - MV repair 01/10/2021 with a 32 mm Edwards Simuform Ring - Intraop echo 01/10/2021 with moderate MV stenosis, mild regurgitation post repair - Goal INR 2-3 - Last coag visit 02/14/21.  Next scheduled on 7/28.  Chronic systolic heart failure - Intraop echo 01/10/2021 with EF 30%, severely dilated LV Entresto  d/c periop 2/2 low BP - Repeat echo in 3 months post revascularization.  If EF remains unchanged, will need to evaluate her for ICD.  HTN  Hyperlipidemia  Diabetes    Past Medical History:  Diagnosis Date   Cancer ([place])    skin on nose   CHF (congestive heart failure) ([place])    Coronary artery disease    Diabetes mellitus without complication ([place])    History of kidney stones    Hyperlipidemia    Hypertension    Kidney stones    Sciatica     Past Surgical History:  Procedure Laterality Date   BACK SURGERY     CHEST EXPLORATION  01/10/2021   Procedure: CHEST EXPLORATION FOR RETAINED FOREIGN OBJECT;  Surgeon: Melrose Nakayama, MD;  Location: [place];  Service: Open Heart Surgery;;   COLONOSCOPY     CORONARY ARTERY BYPASS GRAFT N/A 01/10/2021   Procedure: CORONARY ARTERY BYPASS GRAFTING (CABG) TIMES FOUR, ON PUMP, USING LEFT INTERNAL MAMMARY ARTERY AND ENDOSCOPICALLY HARVESTED LEG VEIN;  Surgeon: Melrose Nakayama, MD;  Location: [place];  Service: Open Heart Surgery;  Laterality: N/A;   ENDOVEIN HARVEST OF GREATER SAPHENOUS VEIN Right 01/10/2021   Procedure: ENDOVEIN HARVEST OF GREATER SAPHENOUS VEIN;  Surgeon: Melrose Nakayama, MD;  Location: [place];  Service: Open Heart Surgery;  Laterality: Right;   LITHOTRIPSY  MITRAL VALVE REPAIR  01/10/2021   Procedure: MITRAL VALVE REPAIR (MVR) USING MEDTRONIC SIMUFORM 32MM RING;  Surgeon: Melrose Nakayama, MD;  Location: [place];  Service: Open Heart Surgery;;   NOSE SURGERY Left 02/28/2017   skin reconstruction of nose from skin cancer   RIGHT/LEFT HEART CATH AND CORONARY ANGIOGRAPHY N/A 12/30/2020   Procedure: RIGHT/LEFT HEART CATH AND CORONARY ANGIOGRAPHY;  Surgeon: Martinique, Peter M, MD;  Location: [place] [facility];  Service: Cardiovascular;  Laterality: N/A;   TEE WITHOUT CARDIOVERSION N/A 01/10/2021   Procedure: TRANSESOPHAGEAL ECHOCARDIOGRAM (TEE);  Surgeon: Melrose Nakayama, MD;  Location: [place];  Service: Open  Heart Surgery;  Laterality: N/A;    Current Medications: No outpatient medications have been marked as taking for the 02/17/21 encounter (Appointment) with Darreld Mclean, PA-C.     Allergies:   Patient has no known allergies.   Social History   Socioeconomic History   Marital status: Married    Spouse name: Not on file   Number of children: Not on file   Years of education: Not on file   Highest education level: Not on file  Occupational History   Occupation: retired Museum/gallery curator  Tobacco Use   Smoking status: Former    Types: Cigarettes    Quit date: 12/22/2017    Years since quitting: 3.1   Smokeless tobacco: Never  Vaping Use   Vaping Use: Never used  Substance and Sexual Activity   Alcohol use: Yes    Alcohol/week: 24.0 standard drinks    Types: 24 Cans of beer per week    Comment: 1 case a week   Drug use: Never   Sexual activity: Not on file  Other Topics Concern   Not on file  Social History Narrative   Not on file   Social Determinants of Health   Financial Resource Strain: Not on file  Food Insecurity: Not on file  Transportation Needs: Not on file  Physical Activity: Not on file  Stress: Not on file  Social Connections: Not on file     Family History: The patient's family history includes COPD in his mother; Heart attack in his father; Heart failure in his mother.  ROS:   Please see the history of present illness.     EKGs/Labs/Other Studies Reviewed:    EKG:  The ekg ordered today demonstrates ***  Recent Labs: 01/07/2021: ALT 35 01/11/2021: Magnesium 2.6 01/13/2021: BUN 20; Creatinine, Ser 0.79; Hemoglobin 7.8; Platelets 77; Potassium 3.5; Sodium 138  Recent Lipid Panel    Component Value Date/Time   CHOL 211 (H) 12/22/2020 1546   TRIG 226 (H) 12/22/2020 1546   HDL 37 (L) 12/22/2020 1546   CHOLHDL 5.7 (H) 12/22/2020 1546   LDLCALC 133 (H) 12/22/2020 1546    Physical Exam:    VS:  There were no vitals taken for this visit.    Wt  Readings from Last 3 Encounters:  02/08/21 190 lb (86.2 kg)  01/19/21 196 lb 9.6 oz (89.2 kg)  01/15/21 201 lb 14.4 oz (91.6 kg)     GEN: *** Well nourished, well developed in no acute distress HEENT: Normal NECK: No JVD; No carotid bruits CARDIAC: ***RRR, no murmurs, rubs, gallops RESPIRATORY:  Clear to auscultation without rales, wheezing or rhonchi  ABDOMEN: Soft, non-tender, non-distended MUSCULOSKELETAL: No edema; No deformity  SKIN: Warm and dry NEUROLOGIC:  Alert and oriented PSYCHIATRIC:  Normal affect   ASSESSMENT AND PLAN   ***  Disposition: ***  {Are you  ordering a CV Procedure (e.g. stress test, cath, DCCV, TEE, etc)?   Press F2        :[id number]}    Medication Adjustments/Labs and Tests Ordered: Current medicines are reviewed at length with the patient today.  Concerns regarding medicines are outlined above.  No orders of the defined types were placed in this encounter.  No orders of the defined types were placed in this encounter.   There are no Patient Instructions on file for this visit.   Signed, Warren Lacy, PA-C  02/16/2021 1:56 PM    [facility]

## 2021-02-17 ENCOUNTER — Ambulatory Visit: Payer: Medicare Other | Admitting: Student

## 2021-02-17 DIAGNOSIS — I2510 Atherosclerotic heart disease of native coronary artery without angina pectoris: Secondary | ICD-10-CM

## 2021-02-17 DIAGNOSIS — Z951 Presence of aortocoronary bypass graft: Secondary | ICD-10-CM

## 2021-02-17 DIAGNOSIS — E785 Hyperlipidemia, unspecified: Secondary | ICD-10-CM

## 2021-02-17 DIAGNOSIS — I050 Rheumatic mitral stenosis: Secondary | ICD-10-CM

## 2021-02-17 DIAGNOSIS — Z9889 Other specified postprocedural states: Secondary | ICD-10-CM

## 2021-02-17 DIAGNOSIS — E119 Type 2 diabetes mellitus without complications: Secondary | ICD-10-CM

## 2021-02-17 DIAGNOSIS — I10 Essential (primary) hypertension: Secondary | ICD-10-CM

## 2021-02-17 DIAGNOSIS — I5022 Chronic systolic (congestive) heart failure: Secondary | ICD-10-CM

## 2021-02-17 DIAGNOSIS — I340 Nonrheumatic mitral (valve) insufficiency: Secondary | ICD-10-CM

## 2021-02-24 ENCOUNTER — Ambulatory Visit (INDEPENDENT_AMBULATORY_CARE_PROVIDER_SITE_OTHER): Payer: Medicare Other | Admitting: *Deleted

## 2021-02-24 DIAGNOSIS — Z9889 Other specified postprocedural states: Secondary | ICD-10-CM | POA: Diagnosis not present

## 2021-02-24 DIAGNOSIS — Z7901 Long term (current) use of anticoagulants: Secondary | ICD-10-CM

## 2021-02-24 LAB — POCT INR: INR: 1.6 — AB (ref 2.0–3.0)

## 2021-02-24 NOTE — Patient Instructions (Signed)
Take warfarin 2 tablets tonight then increase dose to 1 tablet daily except 1 1/2 tablets on Sundays, Tuesdays and Thursdays  Recheck in 10 days Told pt to get pill box and start putting warfarin in it.  He did start that last week.

## 2021-03-08 ENCOUNTER — Other Ambulatory Visit: Payer: Self-pay | Admitting: *Deleted

## 2021-03-08 ENCOUNTER — Ambulatory Visit (INDEPENDENT_AMBULATORY_CARE_PROVIDER_SITE_OTHER): Payer: Self-pay | Admitting: Thoracic Surgery (Cardiothoracic Vascular Surgery)

## 2021-03-08 ENCOUNTER — Other Ambulatory Visit: Payer: Self-pay

## 2021-03-08 ENCOUNTER — Encounter: Payer: Self-pay | Admitting: Thoracic Surgery (Cardiothoracic Vascular Surgery)

## 2021-03-08 VITALS — BP 94/69 | HR 95 | Resp 20 | Ht 69.0 in | Wt 195.0 lb

## 2021-03-08 DIAGNOSIS — G540 Brachial plexus disorders: Secondary | ICD-10-CM

## 2021-03-08 DIAGNOSIS — Z951 Presence of aortocoronary bypass graft: Secondary | ICD-10-CM

## 2021-03-08 DIAGNOSIS — I2510 Atherosclerotic heart disease of native coronary artery without angina pectoris: Secondary | ICD-10-CM

## 2021-03-08 MED ORDER — OXYCODONE HCL 5 MG PO TABS: 5.0000 mg | ORAL_TABLET | Freq: Two times a day (BID) | ORAL | 0 refills | Status: AC | PRN

## 2021-03-08 MED ORDER — PREGABALIN 25 MG PO CAPS: 25.0000 mg | ORAL_CAPSULE | Freq: Two times a day (BID) | ORAL | 2 refills | Status: DC

## 2021-03-08 NOTE — Progress Notes (Signed)
Jonathan [name]       Jonathan [name],Jonathan [name] [id number]             [id number]      HPI: Jonathan [name] returns for a scheduled follow-up visit  Jonathan [name] is a 70 year old man with a past history significant for hypertension, hyperlipidemia, type 2 diabetes, and tobacco abuse.  He did quit smoking about 30 years ago.  He presented with acute diastolic congestive heart failure.  Work-up revealed three-vessel coronary disease.  He was referred for coronary bypass grafting.  I operated on him on 01/10/2021.  TEE showed severe mitral regurgitation.  I did coronary bypass grafting x4 and mitral valve repair.  Postoperative course was notable for brachial plexopathy with numbness in the fourth and fifth fingers on the right hand.  He went home on day 5.  He saw Jonathan [name] in the office on 02/08/2021.  His primary complaint at that time was still has brachial plexopathy.  He has been taking oxycodone for that until he recently ran out.  He says that he has difficulty sleeping due to pain in the arm.  He says that yesterday he was having some dizziness when he first sat up.  He also had that again today.  Past Medical History:  Diagnosis Date   Cancer ([place])    skin on nose   CHF (congestive heart failure) ([facility])    Coronary artery disease    Diabetes mellitus without complication ([facility])    History of kidney stones    Hyperlipidemia    Hypertension    Kidney stones    Sciatica     Current Outpatient Medications  Medication Sig Dispense Refill   acetaminophen (TYLENOL) 500 MG tablet Take 2 tablets (1,000 mg total) by mouth every 6 (six) hours. 30 tablet 0   albuterol (VENTOLIN HFA) 108 (90 Base) MCG/ACT inhaler Inhale 2 puffs into the lungs every 4 (four) hours as needed for shortness of breath or wheezing.     aspirin EC 81 MG EC tablet Take 1 tablet (81 mg total) by mouth daily. Swallow whole. 30 tablet 11   carvedilol (COREG) 6.25 MG tablet Take 1 tablet (6.25 mg total) by mouth  2 (two) times daily with a meal. (Patient taking differently: Take 6.25 mg by mouth 2 (two) times daily with a meal. Take 1/2 tablet twice daily.  Dose reduced by PCP) 60 tablet 1   dapagliflozin propanediol (FARXIGA) 10 MG TABS tablet Take 1 tablet (10 mg total) by mouth daily before breakfast. 30 tablet 11   furosemide (LASIX) 20 MG tablet Take 1 tablet (20 mg total) by mouth daily. 90 tablet 3   metFORMIN (GLUCOPHAGE) 500 MG tablet Take 1 tablet (500 mg total) by mouth 2 (two) times daily.     OVER THE COUNTER MEDICATION Take 1 tablet by mouth daily. Immune Plus     rosuvastatin (CRESTOR) 20 MG tablet Take 1 tablet (20 mg total) by mouth daily. 90 tablet 3   Saline (SIMPLY SALINE) 0.9 % AERS Place 1 spray into the nose every morning.     warfarin (COUMADIN) 5 MG tablet Take 1 tablet (5 mg total) by mouth daily at 4 PM. 30 tablet 1   cholecalciferol (VITAMIN D3) 25 MCG (1000 UNIT) tablet Take 1,000 Units by mouth daily. (Patient not taking: Reported on 03/08/2021)     LORazepam (ATIVAN) 0.5 MG tablet Take 0.5 mg by mouth daily. (Patient not taking: Reported on 03/08/2021)  oxyCODONE (OXY IR/ROXICODONE) 5 MG immediate release tablet Take 1 tablet (5 mg total) by mouth at bedtime as needed for severe pain. (Patient not taking: Reported on 03/08/2021) 30 tablet 0   No current facility-administered medications for this visit.    Physical Exam BP 94/69 (BP Location: Left Arm, Patient Position: Sitting)   Pulse 95   Resp 20   Ht '5\' 9"'$  (1.753 m)   Wt 195 lb (88.5 kg)   SpO2 96% Comment: RA  BMI 28.63 kg/m   70 year old man in no acute distress Alert and oriented x3 with no focal deficits Lungs clear bilaterally Cardiac regular rate and rhythm Sternum stable, incision well-healed Point tenderness left second rib costal cartilage lateral junction Impaired flexion fourth and fifth fingers right hand No peripheral edema  Impression: Jonathan [name], Jonathan [name].  is a 69 year old man with a past history  significant for hypertension, hyperlipidemia, type 2 diabetes, and remote tobacco abuse.  He presented with diastolic heart failure.  He was diagnosed with three-vessel disease.  TEE intraoperatively also showed severe mitral regurgitation.  He underwent coronary bypass grafting x4 and mitral valve annuloplasty on 01/10/2021.  Overall he is doing well at this point in time.  His heart failure is well controlled.  Brachial plexopathy-this is his primary complaint.  He has been taking oxycodone for the pain.  Unfortunately that is doing anything to help long-term.  I gave him a refill for the oxycodone to use when the pain is severe particularly at night so he can sleep.  He understands that oxycodone does build tolerance and can lead to addiction.  I am going to start him on Lyrica 25 mg twice daily as that may help the pain more in the long run.  He understands that that is a medication that you take on a regular basis and it takes time to have a effect.  Going to order physical therapy as well.  He is about 8 weeks out from surgery.  He can stop his Coumadin at this point in time.  There are no restrictions on his activities but he should build into new activities gradually.  He complains of some orthostatic symptoms over the last couple of days.  He does not have any edema.  I advised him to hold his Lasix and see if that improves.  Plan: Hold Lasix.  Resume if gains more than 3 pounds in a day or 5 pounds in 3 days. Stop Coumadin Refilled oxycodone 5 mg twice daily as needed, 30 tablets, no refills Start Lyrica 25 mg p.o. twice daily, 60 tablets, 2 refills Physical therapy for brachial plexopathy Return in 6 weeks to check on progress  Melrose Nakayama, MD [facility] [phone]

## 2021-03-09 ENCOUNTER — Telehealth: Payer: Self-pay

## 2021-03-09 NOTE — Telephone Encounter (Signed)
Pt called in w/ bp concerns stated that they would like to see a pharmacist as they have had low bp and wanted an appointment w/a pharmd. Made appt fo 03/10/21. Pt stated that they recently had surgery bypass are afraid to fall or pass out and need help with bp concerns. They stated that during this call that their right arm is hurting as well. I will route to pharmd pool to make sure that they still need the appt or if they could handle it via phone call. I went ahead and booked appt in case it was needed if not feel free to cancel as it was a precautionary measure

## 2021-03-10 ENCOUNTER — Ambulatory Visit (INDEPENDENT_AMBULATORY_CARE_PROVIDER_SITE_OTHER): Payer: Medicare Other | Admitting: Pharmacist

## 2021-03-10 ENCOUNTER — Other Ambulatory Visit: Payer: Self-pay

## 2021-03-10 VITALS — BP 110/68 | HR 109 | Resp 17 | Ht 69.0 in | Wt 199.4 lb

## 2021-03-10 DIAGNOSIS — I10 Essential (primary) hypertension: Secondary | ICD-10-CM | POA: Diagnosis not present

## 2021-03-10 DIAGNOSIS — I5021 Acute systolic (congestive) heart failure: Secondary | ICD-10-CM | POA: Diagnosis not present

## 2021-03-10 NOTE — Progress Notes (Signed)
Patient ID: Jonathan [name].                 DOB: 04-03-1951                      MRN: [id number]     HPI: Jonathan [name]. is a 70 y.o. male referred by Dr. Martinique to HTN clinic. PMH is significant for CABG x 4, CHF, HTN, CAD, DM, and mitral valve repair.  Patient discharged on carvedilol and Entresto after surgery and was anticoagulated on warfarin.   Patient seen for Entresto titration on 6/22 post hospital discharge but was found to be hypotensive.  Entresto was d/c, however patient has continued to take.    Patient called yesterday and reported he was having ow blood pressure and felt symptomatic.  Patient was scheduled for appt today with PharmD.  Patient presents today in clinic.  Is not completely positive regarding his medications or dosages, his Sister helps organize his medications.  For example, has continued to take Entresto despite it being d/c and chart showed patient taking carvedilol 6.'25mg'$  mg 1/2 tablet BID but patient says he does not take any half tablets.  Feels fatigued, dizzy, sluggish, and reports he gets very lightheaded when going from sitting/laying down to standing position.  Of note, Dr. Roxan Hockey d/c warfarin and furosemide on 8/9 and patient concerned he is gaining weight.    Had a cup of coffee and ate bacon before office visit today.  Patient concerned regarding his arm pain and is supposed to have a physical therapy referral.  Wt Readings from Last 3 Encounters:  03/08/21 195 lb (88.5 kg)  02/08/21 190 lb (86.2 kg)  01/19/21 196 lb 9.6 oz (89.2 kg)   BP Readings from Last 3 Encounters:  03/08/21 94/69  02/08/21 97/65  01/19/21 98/62   Pulse Readings from Last 3 Encounters:  03/08/21 95  02/08/21 100  01/19/21 73    Renal function: CrCl cannot be calculated (Patient's most recent lab result is older than the maximum 21 days allowed.).  Past Medical History:  Diagnosis Date   Cancer ([facility])    skin on nose   CHF (congestive heart failure) ([facility])     Coronary artery disease    Diabetes mellitus without complication ([facility])    History of kidney stones    Hyperlipidemia    Hypertension    Kidney stones    Sciatica     Current Outpatient Medications on File Prior to Visit  Medication Sig Dispense Refill   acetaminophen (TYLENOL) 500 MG tablet Take 2 tablets (1,000 mg total) by mouth every 6 (six) hours. 30 tablet 0   albuterol (VENTOLIN HFA) 108 (90 Base) MCG/ACT inhaler Inhale 2 puffs into the lungs every 4 (four) hours as needed for shortness of breath or wheezing.     aspirin EC 81 MG EC tablet Take 1 tablet (81 mg total) by mouth daily. Swallow whole. 30 tablet 11   carvedilol (COREG) 6.25 MG tablet Take 1 tablet (6.25 mg total) by mouth 2 (two) times daily with a meal. (Patient taking differently: Take 6.25 mg by mouth 2 (two) times daily with a meal. Take 1/2 tablet twice daily.  Dose reduced by PCP) 60 tablet 1   cholecalciferol (VITAMIN D3) 25 MCG (1000 UNIT) tablet Take 1,000 Units by mouth daily. (Patient not taking: Reported on 03/08/2021)     dapagliflozin propanediol (FARXIGA) 10 MG TABS tablet Take 1 tablet (10 mg total) by mouth  daily before breakfast. 30 tablet 11   LORazepam (ATIVAN) 0.5 MG tablet Take 0.5 mg by mouth daily. (Patient not taking: Reported on 03/08/2021)     metFORMIN (GLUCOPHAGE) 500 MG tablet Take 1 tablet (500 mg total) by mouth 2 (two) times daily.     OVER THE COUNTER MEDICATION Take 1 tablet by mouth daily. Immune Plus     oxyCODONE (OXY IR/ROXICODONE) 5 MG immediate release tablet Take 1 tablet (5 mg total) by mouth 2 (two) times daily as needed for up to 20 days for severe pain. 30 tablet 0   pregabalin (LYRICA) 25 MG capsule Take 1 capsule (25 mg total) by mouth 2 (two) times daily. 60 capsule 2   rosuvastatin (CRESTOR) 20 MG tablet Take 1 tablet (20 mg total) by mouth daily. 90 tablet 3   Saline (SIMPLY SALINE) 0.9 % AERS Place 1 spray into the nose every morning.     No current facility-administered  medications on file prior to visit.    No Known Allergies   Assessment/Plan:  1. Hypertension -  Patient BP in room 110/68 which he reports is elevated compared to his reading at provider visit on 8/9. Patient also is symptomatic and is concerned regarding dizziness and fatigue.    Patient was supposed to have d/c Entresto a month ago but did not realize it.  Will have him d/c Entresto at this point and have him monitor BP at home in morning and night.  Gave recommendations of BP cuffs and BP log sheet. Hopefully blood pressure recovers and patient can be started on low dose ACEi/ARB for CHF benefit.  Will keep carvedilol at 6.'25mg'$  BID at this time due to patient's elevated pulse rate.    Will contact patient in 1 week for BP readings.  Advised to begin checing weight at home as well and to contact office if he gains >3# overnight or 5# in a week  Discontinue Entresto 24/26 at this time Continue carvedilol 6.'25mg'$  BID  Karren Cobble, PharmD, BCACP, CDCES, [place] [address], [place], [place] [address] Phone: [phone]; Fax: [phone] 03/10/2021 4:03 PM

## 2021-03-10 NOTE — Patient Instructions (Addendum)
Your blood pressure today was 106/66  I would like you to discontinue your Entresto at this point   Continue your carvedilol 6.'25mg'$  twice a day  Begin checking your blood pressure at home in the morning and evening  The blood pressure cuffs we recommend are made by [employer].  We recommend the [employer] Series Bronze or higher or Series 3 or higher  Continue to weigh yourself daily and let us know if you gain more than 3# overnight or 5 # in a week  Please call with any questions  Karren Cobble, PharmD, BCACP, [place], [place]. [address] [place], [place] [address] Phone: [phone]; Fax: [phone] 03/10/2021 3:01 PM

## 2021-03-13 ENCOUNTER — Other Ambulatory Visit: Payer: Self-pay | Admitting: Physician Assistant

## 2021-03-14 ENCOUNTER — Ambulatory Visit: Payer: Self-pay | Admitting: *Deleted

## 2021-03-17 ENCOUNTER — Other Ambulatory Visit: Payer: Self-pay

## 2021-03-17 ENCOUNTER — Ambulatory Visit: Payer: Medicare Other | Attending: Thoracic Surgery (Cardiothoracic Vascular Surgery)

## 2021-03-17 VITALS — BP 111/77

## 2021-03-17 DIAGNOSIS — G540 Brachial plexus disorders: Secondary | ICD-10-CM | POA: Diagnosis not present

## 2021-03-17 DIAGNOSIS — M79601 Pain in right arm: Secondary | ICD-10-CM | POA: Diagnosis not present

## 2021-03-17 NOTE — Therapy (Signed)
[facility] [place], [place], [address] Phone: [phone]   Fax:  [phone]  Physical Therapy Evaluation  Patient Details  Name: Jonathan [name]. MRN: [id number] Date of Birth: 09-26-1950 Referring Provider (PT): Modesto Charon, MD   Encounter Date: 03/17/2021   PT End of Session - 03/17/21 1724     Visit Number 1    Number of Visits 12    Date for PT Re-Evaluation 04/28/21    Authorization Type UHC    PT Start Time [id number]    PT Stop Time [id number]    PT Time Calculation (min) 45 min    Activity Tolerance Patient tolerated treatment well    Behavior During Therapy WFL for tasks assessed/performed             Past Medical History:  Diagnosis Date   Cancer ([place])    skin on nose   CHF (congestive heart failure) ([facility])    Coronary artery disease    Diabetes mellitus without complication ([place])    History of kidney stones    Hyperlipidemia    Hypertension    Kidney stones    Sciatica     Past Surgical History:  Procedure Laterality Date   BACK SURGERY     CHEST EXPLORATION  01/10/2021   Procedure: CHEST EXPLORATION FOR RETAINED FOREIGN OBJECT;  Surgeon: Melrose Nakayama, MD;  Location: [facility];  Service: Open Heart Surgery;;   COLONOSCOPY     CORONARY ARTERY BYPASS GRAFT N/A 01/10/2021   Procedure: CORONARY ARTERY BYPASS GRAFTING (CABG) TIMES FOUR, ON PUMP, USING LEFT INTERNAL MAMMARY ARTERY AND ENDOSCOPICALLY HARVESTED LEG VEIN;  Surgeon: Melrose Nakayama, MD;  Location: [facility];  Service: Open Heart Surgery;  Laterality: N/A;   ENDOVEIN HARVEST OF GREATER SAPHENOUS VEIN Right 01/10/2021   Procedure: ENDOVEIN HARVEST OF GREATER SAPHENOUS VEIN;  Surgeon: Melrose Nakayama, MD;  Location: [facility];  Service: Open Heart Surgery;  Laterality: Right;   LITHOTRIPSY     MITRAL VALVE REPAIR  01/10/2021   Procedure: MITRAL VALVE REPAIR (MVR) USING MEDTRONIC SIMUFORM 32MM RING;  Surgeon: Melrose Nakayama, MD;   Location: [facility];  Service: Open Heart Surgery;;   NOSE SURGERY Left 02/28/2017   skin reconstruction of nose from skin cancer   RIGHT/LEFT HEART CATH AND CORONARY ANGIOGRAPHY N/A 12/30/2020   Procedure: RIGHT/LEFT HEART CATH AND CORONARY ANGIOGRAPHY;  Surgeon: Martinique, Peter M, MD;  Location: [facility];  Service: Cardiovascular;  Laterality: N/A;   TEE WITHOUT CARDIOVERSION N/A 01/10/2021   Procedure: TRANSESOPHAGEAL ECHOCARDIOGRAM (TEE);  Surgeon: Melrose Nakayama, MD;  Location: [facility];  Service: Open Heart Surgery;  Laterality: N/A;    Vitals:   03/17/21 1456  BP: 111/77      Subjective Assessment - 03/17/21 1656     Subjective Patient presents to OPPT with compliants of pain in the R UE. He reports that this started after his surgery (CABGx4) o on June 13th 2022. He states that is was learned that there was a needle left in him during surgery so he had to be reopened for the removal of the item. He states that he was outstretched on the table for an excessive amount of time due to that limitation. He has a persistent buring pain in his R arm and elbow with numbness that travels to the last two fingers of his hand. He has some neck stiffnedd when it is pressed but states otherwise, it does not hurt with  movement. He states that he has started nerve pain medication but not too sure if it has helped much or not. He wants to trial PT to reduce that pain and reduce him dropping items. He currently cannot hold a coffee cup without dropping it. He denies fevers, chills, night sweats, change in vision,  or difficulty swallowing.    Pertinent History CABG x 4, CHF, HTN, CAD, DM, and mitral valve repair    Limitations Writing;Lifting;Other (comment)   grasping   How long can you sit comfortably? Limited by intermittent tailbone zingers    How long can you stand comfortably? It depends - sometimes my legs get weaker    How long can you walk comfortably? It depends - I dont have a problem  walking but my legs are feeling weak.    Patient Stated Goals to be able to get this pain in my hand to go away    Currently in Pain? Yes    Pain Score 6     Pain Location Arm    Pain Orientation Right    Pain Descriptors / Indicators Aching;Burning;Discomfort;Squeezing;Stabbing;Sharp;Shooting;Pins and needles;Heaviness;Constant;Sore;Tingling;Tightness;Pressure;Numbness    Pain Type Neuropathic pain;Intractable pain    Pain Radiating Towards 4th and 5th digits - forearm pain    Pain Onset More than a month ago    Pain Frequency Constant    Aggravating Factors  it is constant    Pain Relieving Factors nothing.    Effect of Pain on Daily Activities It is in the way - sometimes I cannot sleep                [facility] PT Assessment - 03/17/21 0001       Assessment   Medical Diagnosis Brachial Plexopathy    Referring Provider (PT) Modesto Charon, MD    Onset Date/Surgical Date 01/10/21    Hand Dominance Right    Next MD Visit TBD    Prior Therapy No      Balance Screen   Has the patient fallen in the past 6 months No      [place] residence      Prior Function   Level of Independence Independent      Cognition   Overall Cognitive Status Within Functional Limits for tasks assessed      Observation/Other Assessments   Observations Malcontent disposition    Skin Integrity open wound pen hol size at posterior brachium      Sensation   Light Touch Impaired Detail    Light Touch Impaired Details Impaired RUE      ROM / Strength   AROM / PROM / Strength AROM;Strength      AROM   Overall AROM  Within functional limits for tasks performed    Overall AROM Comments Cervical AROM - WFL, UE AROM WFL      Strength   Overall Strength Comments R UE grip strength poor - unable to apply pressure with R lateral digits.      Flexibility   Soft Tissue Assessment /Muscle Length yes   tnesion at ulnar nerve in cubital tunnel     Palpation    Palpation comment TTP at the posterior brachium, TTP at the lateral border of antebrachium      Special Tests    Special Tests Cervical    Cervical Tests Spurling's      Spurling's   Findings Not Tested    Comment due to severity and irritability, not tested at  this time                        Objective measurements completed on examination: See above findings.                 PT Short Term Goals - 03/17/21 1720       PT SHORT TERM GOAL #1   Title Indep with initial HEP    Time 1    Period Weeks    Status New    Target Date 03/24/21               PT Long Term Goals - 03/17/21 1721       PT LONG TERM GOAL #1   Title Indep with advanced HEP    Baseline at beginner level - lacks awareness of HEP    Time 4    Period Weeks    Status New    Target Date 04/14/21      PT LONG TERM GOAL #2   Title Patient will be able to report ease of ADLs with pain no greater than 2/10    Baseline 5-6/10    Time 6    Period Weeks    Status New    Target Date 04/28/21      PT LONG TERM GOAL #3   Title Patient will be able to demonstrate controlled coordination of hamering with the RUE in order to return to hobbies and home making.    Baseline limited grip and control of held items    Time 6    Period Weeks    Status New    Target Date 04/28/21      PT LONG TERM GOAL #4   Title Patient will be able to produce bilateral grip strneght within normative values for age for ease of ADLs    Baseline poor grip strength < 20# of force    Time 6    Period Weeks    Status New                    Plan - 03/17/21 1028     Clinical Impression Statement Jonathan [name] is a plesant 70 yo male with a history of CABG x 4, CHF, HTN, CAD, DM, and mitral valve repair who arrives to OPPT with complaints of  R arm pain  since recent surgery (01/04/21). He presents with signs and symptoms consistent with referral of brachial plexopathy at the C7-T1 ulnar nerve  root.  He presents with full UE ROM and cervical ROM. Due to severity of symptoms this visit, limited assessment exploired in order to prevent significant increase in nerve pains. Plan to further assess cervical influence on current symptoms in upcoming visit. Therapy goals at this time emphasized nerve mobility to reduce symptoms. SkilledPT recommended to address pain and functional limitations.    Personal Factors and Comorbidities Comorbidity 3+;Time since onset of injury/illness/exacerbation;Past/Current Experience    Comorbidities CABG x 4, CHF, HTN, CAD, DM, and mitral valve repair    Examination-Activity Limitations Dressing;Carry;Other    Examination-Participation Restrictions Cleaning;Meal Prep;Driving    Stability/Clinical Decision Making Evolving/Moderate complexity    Clinical Decision Making Moderate    Rehab Potential Good    PT Frequency 2x / week    PT Duration 6 weeks    PT Treatment/Interventions Electrical Stimulation;Moist Heat;Therapeutic activities;Therapeutic exercise;Neuromuscular re-education;Manual techniques;Patient/family education;Ultrasound;ADLs/Self Care Home Management;Vasopneumatic Device;Traction;Taping    PT Next Visit Plan Assess cervical ROM and influence  on symptoms, brachial plexus taping intervention, low grade pectoral region isometrics physioball squeeze with elbows    PT Home Exercise Plan see pt instructions    Consulted and Agree with Plan of Care Patient             Patient will benefit from skilled therapeutic intervention in order to improve the following deficits and impairments:  Impaired tone, Impaired UE functional use, Decreased strength, Impaired sensation, Decreased skin integrity  Visit Diagnosis: Brachial plexus disorders  Pain in right arm     Problem List Patient Active Problem List   Diagnosis Date Noted   Brachial plexopathy 02/08/2021   S/P CABG x 4 01/10/2021   S/P MVR (mitral valve repair) 01/10/2021   CAD (coronary  artery disease), native coronary artery [id number]   Acute systolic CHF (congestive heart failure) ([facility]) 12/30/2020   Poorly controlled type 2 diabetes mellitus ([facility]) 12/30/2020   HTN (hypertension) 12/30/2020   Hyperlipidemia [id number]   Acute diastolic congestive heart failure ([facility]) 12/20/2020   Dyspnea 12/20/2020   Pain in joint of right knee 10/08/2020   Pain in joint of left knee 09/13/2020   Tobacco dependence 08/19/2020   ED (erectile dysfunction) of organic origin 08/19/2020   Hyperglycemia due to type 2 diabetes mellitus ([facility]) 08/19/2020   Adjustment disorder with mixed emotional features 08/19/2020   Chronic pain 08/19/2020    Marylou Mccoy PT, DPT 03/17/2021, 5:26 PM  [facility] [address] [place], [place], [address] Phone: [phone]   Fax:  [phone]  Name: Jonathan [name]. MRN: [id number] Date of Birth: August 09, 1950

## 2021-03-21 ENCOUNTER — Ambulatory Visit: Payer: Medicare Other

## 2021-03-21 ENCOUNTER — Other Ambulatory Visit: Payer: Self-pay

## 2021-03-21 VITALS — BP 124/80 | HR 87

## 2021-03-21 DIAGNOSIS — G540 Brachial plexus disorders: Secondary | ICD-10-CM

## 2021-03-21 DIAGNOSIS — M79601 Pain in right arm: Secondary | ICD-10-CM

## 2021-03-21 NOTE — Therapy (Signed)
[facility] [place], [place], [address] Phone: [phone]   Fax:  [phone]  Physical Therapy Treatment  Patient Details  Name: Jonathan [name]. MRN: [id number] Date of Birth: 1951-06-26 Referring Provider (PT): Modesto Charon, MD   Encounter Date: 03/21/2021   PT End of Session - 03/21/21 1302     Visit Number 2    Number of Visits 12    Date for PT Re-Evaluation 04/28/21    Authorization Type UHC    PT Start Time 0730    PT Stop Time 0815    PT Time Calculation (min) 45 min             Past Medical History:  Diagnosis Date   Cancer ([place])    skin on nose   CHF (congestive heart failure) ([place])    Coronary artery disease    Diabetes mellitus without complication ([place])    History of kidney stones    Hyperlipidemia    Hypertension    Kidney stones    Sciatica     Past Surgical History:  Procedure Laterality Date   BACK SURGERY     CHEST EXPLORATION  01/10/2021   Procedure: CHEST EXPLORATION FOR RETAINED FOREIGN OBJECT;  Surgeon: Melrose Nakayama, MD;  Location: [place];  Service: Open Heart Surgery;;   COLONOSCOPY     CORONARY ARTERY BYPASS GRAFT N/A 01/10/2021   Procedure: CORONARY ARTERY BYPASS GRAFTING (CABG) TIMES FOUR, ON PUMP, USING LEFT INTERNAL MAMMARY ARTERY AND ENDOSCOPICALLY HARVESTED LEG VEIN;  Surgeon: Melrose Nakayama, MD;  Location: [place];  Service: Open Heart Surgery;  Laterality: N/A;   ENDOVEIN HARVEST OF GREATER SAPHENOUS VEIN Right 01/10/2021   Procedure: ENDOVEIN HARVEST OF GREATER SAPHENOUS VEIN;  Surgeon: Melrose Nakayama, MD;  Location: [place];  Service: Open Heart Surgery;  Laterality: Right;   LITHOTRIPSY     MITRAL VALVE REPAIR  01/10/2021   Procedure: MITRAL VALVE REPAIR (MVR) USING MEDTRONIC SIMUFORM 32MM RING;  Surgeon: Melrose Nakayama, MD;  Location: [place];  Service: Open Heart Surgery;;   NOSE SURGERY Left 02/28/2017   skin reconstruction of nose from  skin cancer   RIGHT/LEFT HEART CATH AND CORONARY ANGIOGRAPHY N/A 12/30/2020   Procedure: RIGHT/LEFT HEART CATH AND CORONARY ANGIOGRAPHY;  Surgeon: Martinique, Peter M, MD;  Location: [place] [facility];  Service: Cardiovascular;  Laterality: N/A;   TEE WITHOUT CARDIOVERSION N/A 01/10/2021   Procedure: TRANSESOPHAGEAL ECHOCARDIOGRAM (TEE);  Surgeon: Melrose Nakayama, MD;  Location: [place];  Service: Open Heart Surgery;  Laterality: N/A;    Vitals:   03/21/21 0741  BP: 124/80  Pulse: 87     Subjective Assessment - 03/21/21 0735     Subjective COVID-19 screening performed upon arrival. Patient states that he has not been sleeping well and took an oxycodone to get to sleep and he cantell the idfference this morning. He used to take the gabapentin and it was not helping.    Pertinent History CABG x 4, CHF, HTN, CAD, DM, and mitral valve repair    Limitations Writing;Lifting;Other (comment)    How long can you sit comfortably? Limited by intermittent tailbone zingers    How long can you stand comfortably? It depends - sometimes my legs get weaker    How long can you walk comfortably? It depends - I dont have a problem walking but my legs are feeling weak.    Patient Stated Goals to be able to get this pain in  my hand to go away    Currently in Pain? Yes    Pain Score 6     Pain Location Arm    Pain Orientation Right    Pain Descriptors / Indicators Aching;Discomfort;Numbness;Tightness    Pain Type Neuropathic pain    Pain Onset More than a month ago    Pain Frequency Constant                [facility] PT Assessment - 03/21/21 0001       Strength   Overall Strength Comments Grip ( R 20lbs/force)  (L 80lbs/force)                           [facility] Adult PT Treatment/Exercise - 03/21/21 0001       Neuro Re-ed    Neuro Re-ed Details  [place] and [place] bending red flexbar, thoracic rotation (open book) sidelying; scapular retraction      Manual Therapy   Manual Therapy  Passive ROM;Soft tissue mobilization;Joint mobilization    Manual therapy comments performed supine .PROM of RUE to glide neres (ulnar and radial)    Joint Mobilization rotational mob of ulnar/radius,    Soft tissue mobilization STM at the radius                      PT Short Term Goals - 03/17/21 1720       PT SHORT TERM GOAL #1   Title Indep with initial HEP    Time 1    Period Weeks    Status New    Target Date 03/24/21               PT Long Term Goals - 03/17/21 1721       PT LONG TERM GOAL #1   Title Indep with advanced HEP    Baseline at beginner level - lacks awareness of HEP    Time 4    Period Weeks    Status New    Target Date 04/14/21      PT LONG TERM GOAL #2   Title Patient will be able to report ease of ADLs with pain no greater than 2/10    Baseline 5-6/10    Time 6    Period Weeks    Status New    Target Date 04/28/21      PT LONG TERM GOAL #3   Title Patient will be able to demonstrate controlled coordination of hamering with the RUE in order to return to hobbies and home making.    Baseline limited grip and control of held items    Time 6    Period Weeks    Status New    Target Date 04/28/21      PT LONG TERM GOAL #4   Title Patient will be able to produce bilateral grip strneght within normative values for age for ease of ADLs    Baseline poor grip strength < 20# of force    Time 6    Period Weeks    Status New                   Plan - 03/21/21 1437     Clinical Impression Statement Patient tolerates therapy well today and reports a decrease in disconmfort at end of session. Tactile cue provided to enocurae patient to manage home self-scar tiussue mobilization. He was able to progress with forearm strength activities in clinic  this session. Cervical assessment demonstrated limited influence of c/c. However patient did report increase in low back pain with the assessment. Skilled PT recommended to encourage reduced  neuropathic pain.    Personal Factors and Comorbidities Comorbidity 3+;Time since onset of injury/illness/exacerbation;Past/Current Experience    Comorbidities CABG x 4, CHF, HTN, CAD, DM, and mitral valve repair    Examination-Participation Restrictions Cleaning;Meal Prep;Driving    Stability/Clinical Decision Making Evolving/Moderate complexity    Clinical Decision Making Moderate    Rehab Potential Good    PT Frequency 2x / week    PT Duration 6 weeks    PT Treatment/Interventions Electrical Stimulation;Moist Heat;Therapeutic activities;Therapeutic exercise;Neuromuscular re-education;Manual techniques;Patient/family education;Ultrasound;ADLs/Self Care Home Management;Vasopneumatic Device;Traction;Taping    PT Next Visit Plan brachial plexus taping intervention (?) , low grade pectoral region isometrics physioball squeeze with elbows cnt'd    PT Home Exercise Plan see pt instructions             Patient will benefit from skilled therapeutic intervention in order to improve the following deficits and impairments:  Impaired tone, Impaired UE functional use, Decreased strength, Impaired sensation, Decreased skin integrity  Visit Diagnosis: Brachial plexus disorders  Pain in right arm     Problem List Patient Active Problem List   Diagnosis Date Noted   Brachial plexopathy 02/08/2021   S/P CABG x 4 01/10/2021   S/P MVR (mitral valve repair) 01/10/2021   CAD (coronary artery disease), native coronary artery [id number]   Acute systolic CHF (congestive heart failure) ([place]) 12/30/2020   Poorly controlled type 2 diabetes mellitus ([place]) 12/30/2020   HTN (hypertension) 12/30/2020   Hyperlipidemia [id number]   Acute diastolic congestive heart failure ([place]) 12/20/2020   Dyspnea 12/20/2020   Pain in joint of right knee 10/08/2020   Pain in joint of left knee 09/13/2020   Tobacco dependence 08/19/2020   ED (erectile dysfunction) of organic origin 08/19/2020   Hyperglycemia due to  type 2 diabetes mellitus ([place]) 08/19/2020   Adjustment disorder with mixed emotional features 08/19/2020   Chronic pain 08/19/2020    Marylou Mccoy PT, DPT 03/21/2021, 3:24 PM  [facility] [address] [place], [place], [address] Phone: [phone]   Fax:  [phone]  Name: Jonathan [name]. MRN: [id number] Date of Birth: 05-07-1951

## 2021-03-25 ENCOUNTER — Ambulatory Visit: Payer: Medicare Other | Admitting: Physical Therapy

## 2021-03-25 ENCOUNTER — Other Ambulatory Visit: Payer: Self-pay

## 2021-03-25 DIAGNOSIS — M79601 Pain in right arm: Secondary | ICD-10-CM

## 2021-03-25 DIAGNOSIS — G540 Brachial plexus disorders: Secondary | ICD-10-CM | POA: Diagnosis not present

## 2021-03-25 NOTE — Therapy (Addendum)
[facility] [place], [place], [address] Phone: [phone]   Fax:  [phone]  Physical Therapy Treatment  Patient Details  Name: Jonathan [name]. MRN: [id number] Date of Birth: [birth year] Referring Provider (PT): Modesto Charon, MD   Encounter Date: 03/25/2021   PT End of Session - 03/25/21 0817     Visit Number 3    Number of Visits 12    Date for PT Re-Evaluation 04/28/21    Authorization Type UHC    PT Start Time 0730    PT Stop Time 0820    PT Time Calculation (min) 50 min    Activity Tolerance Patient tolerated treatment well    Behavior During Therapy WFL for tasks assessed/performed             Past Medical History:  Diagnosis Date   Cancer ([place])    skin on nose   CHF (congestive heart failure) ([place])    Coronary artery disease    Diabetes mellitus without complication ([place])    History of kidney stones    Hyperlipidemia    Hypertension    Kidney stones    Sciatica     Past Surgical History:  Procedure Laterality Date   BACK SURGERY     CHEST EXPLORATION  01/10/2021   Procedure: CHEST EXPLORATION FOR RETAINED FOREIGN OBJECT;  Surgeon: Melrose Nakayama, MD;  Location: [place];  Service: Open Heart Surgery;;   COLONOSCOPY     CORONARY ARTERY BYPASS GRAFT N/A 01/10/2021   Procedure: CORONARY ARTERY BYPASS GRAFTING (CABG) TIMES FOUR, ON PUMP, USING LEFT INTERNAL MAMMARY ARTERY AND ENDOSCOPICALLY HARVESTED LEG VEIN;  Surgeon: Melrose Nakayama, MD;  Location: [place];  Service: Open Heart Surgery;  Laterality: N/A;   ENDOVEIN HARVEST OF GREATER SAPHENOUS VEIN Right 01/10/2021   Procedure: ENDOVEIN HARVEST OF GREATER SAPHENOUS VEIN;  Surgeon: Melrose Nakayama, MD;  Location: [facility];  Service: Open Heart Surgery;  Laterality: Right;   LITHOTRIPSY     MITRAL VALVE REPAIR  01/10/2021   Procedure: MITRAL VALVE REPAIR (MVR) USING MEDTRONIC SIMUFORM 32MM RING;  Surgeon: Melrose Nakayama, MD;   Location: [facility];  Service: Open Heart Surgery;;   NOSE SURGERY Left 02/28/2017   skin reconstruction of nose from skin cancer   RIGHT/LEFT HEART CATH AND CORONARY ANGIOGRAPHY N/A 12/30/2020   Procedure: RIGHT/LEFT HEART CATH AND CORONARY ANGIOGRAPHY;  Surgeon: Martinique, Peter M, MD;  Location: [facility];  Service: Cardiovascular;  Laterality: N/A;   TEE WITHOUT CARDIOVERSION N/A 01/10/2021   Procedure: TRANSESOPHAGEAL ECHOCARDIOGRAM (TEE);  Surgeon: Melrose Nakayama, MD;  Location: [facility];  Service: Open Heart Surgery;  Laterality: N/A;    There were no vitals filed for this visit.   Subjective Assessment - 03/25/21 0824     Subjective COVID-19 screen performed prior to patient entering clinic.  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  Hard to hold my drill and hammer nails.    Pertinent History CABG x 4, CHF, HTN, CAD, DM, and mitral valve repair    How long can you sit comfortably? Limited by intermittent tailbone zingers    How long can you stand comfortably? It depends - sometimes my legs get weaker    How long can you walk comfortably? It depends - I dont have a problem walking but my legs are feeling weak.    Patient Stated Goals to be able to get this pain in my hand to go away  Currently in Pain? Yes    Pain Score 6     Pain Location Arm    Pain Orientation Right    Pain Descriptors / Indicators Aching;Discomfort;Numbness;Tightness    Pain Type Neuropathic pain                               [facility] Adult PT Treatment/Exercise - 03/25/21 0001       Exercises   Exercises Shoulder;Hand      Shoulder Exercises: ROM/Strengthening   UBE (Upper Arm Bike) 6 minutes at 120 RPM's.      Hand Exercises   Other Hand Exercises Yellow putty gripping (right) x 3 minutes      Modalities   Modalities Electrical Stimulation;Moist Heat      Moist Heat Therapy   Number Minutes Moist Heat 18 Minutes    Moist Heat Location --   Right forearm.      Acupuncturist Location Right proximal extensor surface of right forearm    Electrical Stimulation Action IFC at 80-150 Hz.    Electrical Stimulation Parameters 40% scan x 18 minutes.      Manual Therapy   Manual Therapy Soft tissue mobilization    Soft tissue mobilization STW/M x 14 minutes over extensor surface of his right forearm including brachioradialis.                    PT Education - 03/25/21 [id number]     Education Details Yellow and red theraputty for gripping exercise.    Person(s) Educated Patient    Methods Explanation;Demonstration    Comprehension Verbalized understanding;Returned demonstration              PT Short Term Goals - 03/17/21 1720       PT SHORT TERM GOAL #1   Title Indep with initial HEP    Time 1    Period Weeks    Status New    Target Date 03/24/21               PT Long Term Goals - 03/17/21 1721       PT LONG TERM GOAL #1   Title Indep with advanced HEP    Baseline at beginner level - lacks awareness of HEP    Time 4    Period Weeks    Status New    Target Date 04/14/21      PT LONG TERM GOAL #2   Title Patient will be able to report ease of ADLs with pain no greater than 2/10    Baseline 5-6/10    Time 6    Period Weeks    Status New    Target Date 04/28/21      PT LONG TERM GOAL #3   Title Patient will be able to demonstrate controlled coordination of hamering with the RUE in order to return to hobbies and home making.    Baseline limited grip and control of held items    Time 6    Period Weeks    Status New    Target Date 04/28/21      PT LONG TERM GOAL #4   Title Patient will be able to produce bilateral grip strneght within normative values for age for ease of ADLs    Baseline poor grip strength < 20# of force    Time 6    Period Weeks    Status New  Plan - 03/25/21 0853     Clinical Impression Statement Patient did very well with  treatment today.  He was found to be tender to palpation in the region of his right proximal elbow extensor muscular and Brachioradialis.  He was provede with theraputty for home exercise as his grip is significantly weaker on the right per contralateral comparison.    Personal Factors and Comorbidities Comorbidity 3+;Time since onset of injury/illness/exacerbation;Past/Current Experience    Comorbidities CABG x 4, CHF, HTN, CAD, DM, and mitral valve repair    Examination-Activity Limitations Dressing;Carry;Other    Examination-Participation Restrictions Cleaning;Meal Prep;Driving    Stability/Clinical Decision Making Evolving/Moderate complexity    Rehab Potential Good    PT Frequency 2x / week    PT Duration 6 weeks    PT Next Visit Plan brachial plexus taping intervention (?) , low grade pectoral region isometrics physioball squeeze with elbows cnt'd    PT Home Exercise Plan see pt instructions    Consulted and Agree with Plan of Care Patient             Patient will benefit from skilled therapeutic intervention in order to improve the following deficits and impairments:     Visit Diagnosis: Pain in right arm     Problem List Patient Active Problem List   Diagnosis Date Noted   Brachial plexopathy 02/08/2021   S/P CABG x 4 01/10/2021   S/P MVR (mitral valve repair) 01/10/2021   CAD (coronary artery disease), native coronary artery [id number]   Acute systolic CHF (congestive heart failure) ([place]) 12/30/2020   Poorly controlled type 2 diabetes mellitus ([place]) 12/30/2020   HTN (hypertension) 12/30/2020   Hyperlipidemia 99991111   Acute diastolic congestive heart failure ([place]) 12/20/2020   Dyspnea 12/20/2020   Pain in joint of right knee 10/08/2020   Pain in joint of left knee 09/13/2020   Tobacco dependence 08/19/2020   ED (erectile dysfunction) of organic origin 08/19/2020   Hyperglycemia due to type 2 diabetes mellitus ([place]) 08/19/2020   Adjustment disorder with mixed  emotional features 08/19/2020   Chronic pain 08/19/2020    Carlisle Torgeson, [place] MPT 03/25/2021, 12:56 PM  [facility] [address] [place], [place], [address] Phone: [phone]   Fax:  [phone]  Name: Jonathan [name]. MRN: [id number] Date of Birth: [birth year]

## 2021-03-28 ENCOUNTER — Ambulatory Visit: Payer: Medicare Other

## 2021-03-31 ENCOUNTER — Encounter: Payer: Medicare Other | Admitting: Physical Therapy

## 2021-03-31 DIAGNOSIS — Z7984 Long term (current) use of oral hypoglycemic drugs: Secondary | ICD-10-CM | POA: Diagnosis not present

## 2021-03-31 DIAGNOSIS — I10 Essential (primary) hypertension: Secondary | ICD-10-CM | POA: Diagnosis not present

## 2021-03-31 DIAGNOSIS — R202 Paresthesia of skin: Secondary | ICD-10-CM | POA: Diagnosis not present

## 2021-03-31 DIAGNOSIS — E119 Type 2 diabetes mellitus without complications: Secondary | ICD-10-CM | POA: Diagnosis not present

## 2021-04-19 ENCOUNTER — Ambulatory Visit: Payer: Medicare Other | Admitting: Thoracic Surgery (Cardiothoracic Vascular Surgery)

## 2021-04-19 ENCOUNTER — Ambulatory Visit: Payer: Medicare Other | Admitting: Physician Assistant

## 2021-04-19 NOTE — Progress Notes (Deleted)
[facility] [address]       [place] [address]             [phone]      HPI: Mr. Jonathan [name] returns for a scheduled follow-up visit  Jonathan [name] is a 70 year old man with a past history significant for hypertension, hyperlipidemia, type 2 diabetes, and tobacco abuse.  He did quit smoking about 30 years ago.  He presented with acute diastolic congestive heart failure.  Work-up revealed three-vessel coronary disease.  He was referred for coronary bypass grafting.  Dr. Roxan Hockey operated on him on 01/10/2021.  TEE showed severe mitral regurgitation.  He did coronary bypass grafting x4 and mitral valve repair.  Postoperative course was notable for brachial plexopathy with numbness in the fourth and fifth fingers on the right hand.  He went home on post-op day 5.  He saw Enid Cutter in the office on 02/08/2021.  His primary complaint at that time was still has brachial plexopathy.  He has been taking oxycodone for that until he recently ran out.  He says that he has difficulty sleeping due to pain in the arm.  He says that yesterday he was having some dizziness when he first sat up.  He then saw Dr. Roxan Hockey and was started on Lyrica and was provided a new Oxycodone prescription. He has also be doing some physical therapy for his brachial plexopathy.   He presents back to the office today for a follow-up appointment.   Past Medical History:  Diagnosis Date   Cancer ([place])    skin on nose   CHF (congestive heart failure) ([facility])    Coronary artery disease    Diabetes mellitus without complication ([facility])    History of kidney stones    Hyperlipidemia    Hypertension    Kidney stones    Sciatica     Current Outpatient Medications  Medication Sig Dispense Refill   acetaminophen (TYLENOL) 500 MG tablet Take 2 tablets (1,000 mg total) by mouth every 6 (six) hours. 30 tablet 0   albuterol (VENTOLIN HFA) 108 (90 Base) MCG/ACT inhaler Inhale 2 puffs into the lungs every 4  (four) hours as needed for shortness of breath or wheezing.     aspirin EC 81 MG EC tablet Take 1 tablet (81 mg total) by mouth daily. Swallow whole. 30 tablet 11   carvedilol (COREG) 6.25 MG tablet Take 1 tablet (6.25 mg total) by mouth 2 (two) times daily with a meal. (Patient taking differently: Take 6.25 mg by mouth 2 (two) times daily with a meal. Take 1/2 tablet twice daily.  Dose reduced by PCP) 60 tablet 1   dapagliflozin propanediol (FARXIGA) 10 MG TABS tablet Take 1 tablet (10 mg total) by mouth daily before breakfast. 30 tablet 11   furosemide (LASIX) 20 MG tablet Take 1 tablet (20 mg total) by mouth daily. 90 tablet 3   metFORMIN (GLUCOPHAGE) 500 MG tablet Take 1 tablet (500 mg total) by mouth 2 (two) times daily.     OVER THE COUNTER MEDICATION Take 1 tablet by mouth daily. Immune Plus     rosuvastatin (CRESTOR) 20 MG tablet Take 1 tablet (20 mg total) by mouth daily. 90 tablet 3   Saline (SIMPLY SALINE) 0.9 % AERS Place 1 spray into the nose every morning.     warfarin (COUMADIN) 5 MG tablet Take 1 tablet (5 mg total) by mouth daily at 4 PM. 30 tablet 1   cholecalciferol (VITAMIN  D3) 25 MCG (1000 UNIT) tablet Take 1,000 Units by mouth daily. (Patient not taking: Reported on 03/08/2021)     LORazepam (ATIVAN) 0.5 MG tablet Take 0.5 mg by mouth daily. (Patient not taking: Reported on 03/08/2021)     oxyCODONE (OXY IR/ROXICODONE) 5 MG immediate release tablet Take 1 tablet (5 mg total) by mouth at bedtime as needed for severe pain. (Patient not taking: Reported on 03/08/2021) 30 tablet 0   No current facility-administered medications for this visit.    Physical Exam:   70 year old man in no acute distress Alert and oriented x3 with no focal deficits Lungs clear bilaterally Cardiac regular rate and rhythm Sternum stable, incision well-healed Point tenderness left second rib costal cartilage lateral junction Impaired flexion fourth and fifth fingers right hand No peripheral  edema  Impression:  Jonathan [name], Jonathan [name].  is a 70 year old man with a past history significant for hypertension, hyperlipidemia, type 2 diabetes, and remote tobacco abuse.  He presented with diastolic heart failure.  He was diagnosed with three-vessel disease.  TEE intraoperatively also showed severe mitral regurgitation.  He underwent coronary bypass grafting x4 and mitral valve annuloplasty on 01/10/2021.  Overall he is doing well at this point in time.  His heart failure is well controlled.  Brachial plexopathy-this is his primary complaint.  He has been taking oxycodone for the pain.  Unfortunately that is doing anything to help long-term.  I gave him a refill for the oxycodone to use when the pain is severe particularly at night so he can sleep.  He understands that oxycodone does build tolerance and can lead to addiction.  I am going to start him on Lyrica 25 mg twice daily as that may help the pain more in the long run.  He understands that that is a medication that you take on a regular basis and it takes time to have a effect.  Going to order physical therapy as well.  He is about 13 weeks out from surgery.    There are no restrictions on his activities but he should build into new activities gradually.  He complains of some orthostatic symptoms over the last couple of days.  He does not have any edema.  I advised him to hold his Lasix and see if that improves.  Plan:   Nicholes Rough, PA-C [facility] [phone]

## 2021-04-20 DIAGNOSIS — Z23 Encounter for immunization: Secondary | ICD-10-CM | POA: Diagnosis not present

## 2021-04-20 DIAGNOSIS — E119 Type 2 diabetes mellitus without complications: Secondary | ICD-10-CM | POA: Diagnosis not present

## 2021-04-20 DIAGNOSIS — R234 Changes in skin texture: Secondary | ICD-10-CM | POA: Diagnosis not present

## 2021-04-27 ENCOUNTER — Other Ambulatory Visit: Payer: Self-pay

## 2021-04-27 ENCOUNTER — Encounter: Payer: Self-pay | Admitting: Physician Assistant

## 2021-04-27 ENCOUNTER — Ambulatory Visit (INDEPENDENT_AMBULATORY_CARE_PROVIDER_SITE_OTHER): Payer: Medicare Other | Admitting: Physician Assistant

## 2021-04-27 ENCOUNTER — Other Ambulatory Visit: Payer: Self-pay | Admitting: Thoracic Surgery (Cardiothoracic Vascular Surgery)

## 2021-04-27 VITALS — BP 108/70 | HR 90 | Resp 20 | Ht 69.0 in | Wt 202.0 lb

## 2021-04-27 DIAGNOSIS — I2510 Atherosclerotic heart disease of native coronary artery without angina pectoris: Secondary | ICD-10-CM

## 2021-04-27 DIAGNOSIS — I340 Nonrheumatic mitral (valve) insufficiency: Secondary | ICD-10-CM | POA: Diagnosis not present

## 2021-04-27 DIAGNOSIS — Z951 Presence of aortocoronary bypass graft: Secondary | ICD-10-CM | POA: Diagnosis not present

## 2021-04-27 NOTE — Progress Notes (Signed)
HPI Jonathan [name] is a 70 year old gentleman with past history of hypertension, type 2 diabetes mellitus, remote tobacco use having quit 30 years ago, dyslipidemia, and coronary artery disease.  He is status post coronary bypass grafting by Dr. Roxan Hockey on 01/11/2019.  At that time of surgery, he was noted to have severe mitral insufficiency on TEE.  Mitral valve repair was also accomplished same time.  His hospital course postoperatively was uncomplicated except for some numbness in the fourth and fifth fingers on his right hand.  The symptoms persisted after surgery.  When he was seen in follow-up by Dr. Roxan Hockey on 03/08/2021, he was started on Lyrica and was also set up with physical therapy for management of suspected brachial plexopathy. Mr. Jonathan [name] returns today for scheduled follow-up.  Overall, he says he is feeling much better.  He has begun working on a couple building projects on his property.  His Lyrica was uptitrated by his primary care provider to 50 mg twice daily.  He says the pain in his right arm is nearly completely resolved but he continues to have the numbness in his fourth and fifth fingers.  In addition, he has occasional sharp but very brief pain in the mid part of the sternum that seems to be provoked by changing position. He denies any cough, fever, shortness of breath, swelling.   Current Outpatient Medications  Medication Sig Dispense Refill   acetaminophen (TYLENOL) 500 MG tablet Take 2 tablets (1,000 mg total) by mouth every 6 (six) hours. 30 tablet 0   albuterol (VENTOLIN HFA) 108 (90 Base) MCG/ACT inhaler Inhale 2 puffs into the lungs every 4 (four) hours as needed for shortness of breath or wheezing.     aspirin EC 81 MG EC tablet Take 1 tablet (81 mg total) by mouth daily. Swallow whole. 30 tablet 11   carvedilol (COREG) 6.25 MG tablet Take 1 tablet (6.25 mg total) by mouth 2 (two) times daily with a meal. 60 tablet 1   dapagliflozin propanediol (FARXIGA) 10 MG TABS  tablet Take 1 tablet (10 mg total) by mouth daily before breakfast. 30 tablet 11   LORazepam (ATIVAN) 0.5 MG tablet Take 0.5 mg by mouth daily.     metFORMIN (GLUCOPHAGE) 500 MG tablet Take 1 tablet (500 mg total) by mouth 2 (two) times daily.     OVER THE COUNTER MEDICATION Take 1 tablet by mouth daily. Immune Plus (Patient not taking: Reported on 03/10/2021)     pregabalin (LYRICA) 25 MG capsule Take 1 capsule (25 mg total) by mouth 2 (two) times daily. 60 capsule 2   rosuvastatin (CRESTOR) 20 MG tablet Take 1 tablet (20 mg total) by mouth daily. 90 tablet 3   Saline (SIMPLY SALINE) 0.9 % AERS Place 1 spray into the nose every morning.     No current facility-administered medications for this visit.     Review of Systems:   Physical Exam Blood pressure 108/70 Pulse 90 Respirations 20 O2 sat 94%  Heart-regular rate and rhythm, no murmur. Chest-breath sounds are clear to auscultation the sternal incision is well-healed.  Sternum is stable.  He does have small focus of point tenderness at the lower end of the sternotomy incision could be over a wire. Extremities-no peripheral edema Neuro-grip strength in his right hand has improved but he continues to have numbness in his fourth and fifth fingers. Diagnostic Tests:   Impression / Plan: Continue to progress cardiac standpoint following coronary bypass grafting and mitral valve repair in June of  this year. He continues to have evidence of right brachial plexopathy but the pain in his right arm and right hand is much more manageable with the addition of Lyrica.  Primary care physician is currently prescribing refills for this medication.  He is otherwise not requiring any pain medicines except for occasional Aleve. He has not had follow-up echo.  We will plan to schedule echo in 2 to 3 weeks and I have asked him to follow-up with Dr. Roxan Hockey for the results of that study in about 1 month.   Jonathan Odea, PA-C [facility] [phone]

## 2021-04-27 NOTE — Patient Instructions (Signed)
We will schedule for echocardiogram in 2 to 3 weeks  Follow-up with Dr. Roxan Hockey in about 1 month.  No change in medications from CT surgery standpoint.  May advance activity as tolerated.

## 2021-05-13 ENCOUNTER — Ambulatory Visit (HOSPITAL_COMMUNITY): Payer: Medicare Other | Attending: Internal Medicine

## 2021-05-13 ENCOUNTER — Other Ambulatory Visit: Payer: Self-pay

## 2021-05-13 DIAGNOSIS — I2510 Atherosclerotic heart disease of native coronary artery without angina pectoris: Secondary | ICD-10-CM | POA: Diagnosis not present

## 2021-05-13 DIAGNOSIS — Z951 Presence of aortocoronary bypass graft: Secondary | ICD-10-CM

## 2021-05-13 LAB — ECHOCARDIOGRAM COMPLETE
Area-P 1/2: 4.21 cm2
MV VTI: 2.94 cm2
S' Lateral: 4.8 cm

## 2021-05-24 ENCOUNTER — Ambulatory Visit (INDEPENDENT_AMBULATORY_CARE_PROVIDER_SITE_OTHER): Payer: Medicare Other | Admitting: Thoracic Surgery (Cardiothoracic Vascular Surgery)

## 2021-05-24 ENCOUNTER — Encounter: Payer: Self-pay | Admitting: Thoracic Surgery (Cardiothoracic Vascular Surgery)

## 2021-05-24 ENCOUNTER — Other Ambulatory Visit: Payer: Self-pay

## 2021-05-24 VITALS — BP 120/74 | HR 88 | Resp 20 | Ht 69.0 in | Wt 205.0 lb

## 2021-05-24 DIAGNOSIS — I2510 Atherosclerotic heart disease of native coronary artery without angina pectoris: Secondary | ICD-10-CM

## 2021-05-24 DIAGNOSIS — Z951 Presence of aortocoronary bypass graft: Secondary | ICD-10-CM

## 2021-05-24 NOTE — Progress Notes (Signed)
[address]       [place] [address]             [phone]     HPI: Jonathan [name] returns for a scheduled follow-up visit  Jonathan [name] is a 70 year old man with a past medical history significant for hypertension, hyperlipidemia, type 2 diabetes, and tobacco abuse.  He did quit smoking about 30 years ago.  He recently presented with congestive heart failure.  He had a follow-up right and left heart catheterization which showed severe three-vessel disease with normal LV EDP right-sided heart pressures.  No ejection fraction was done.  He underwent coronary bypass grafting and mitral valve repair on 01/12/2021.  His intraoperative echo showed an ejection fraction of 20 to 30% with severe MR.  His postoperative course was complicated by brachial plexopathy.  I saw him in the office on 03/08/2021.  I started him on Lyrica 25 mg twice daily for that.  Apparently that was then increased to 50 mg twice daily by his primary care.  He saw Jonathan [name] on 04/27/2021.  He was feeling better at that time with a improved pain in the right arm but continued to have numbness in the fourth and fifth fingers.  Since that visit he says he continues to have some pain in his right arm and hand.  Apparently his Lyrica prescription ran out but he did not get a refill.  He is not 100% sure which medications he is currently taking.  He has not had any recurrent angina.  He denies peripheral edema and orthopnea.  Past Medical History:  Diagnosis Date   Cancer ([place])    skin on nose   CHF (congestive heart failure) (HCC)    Coronary artery disease    Diabetes mellitus without complication (HCC)    History of kidney stones    Hyperlipidemia    Hypertension    Kidney stones    Sciatica     Current Outpatient Medications  Medication Sig Dispense Refill   albuterol (VENTOLIN HFA) 108 (90 Base) MCG/ACT inhaler Inhale 2 puffs into the lungs every 4 (four) hours as needed for shortness of  breath or wheezing.     aspirin EC 81 MG EC tablet Take 1 tablet (81 mg total) by mouth daily. Swallow whole. 30 tablet 11   carvedilol (COREG) 6.25 MG tablet Take 1 tablet (6.25 mg total) by mouth 2 (two) times daily with a meal. 60 tablet 1   dapagliflozin propanediol (FARXIGA) 10 MG TABS tablet Take 1 tablet (10 mg total) by mouth daily before breakfast. 30 tablet 11   LORazepam (ATIVAN) 0.5 MG tablet Take 0.5 mg by mouth daily.     metFORMIN (GLUCOPHAGE) 500 MG tablet Take 1 tablet (500 mg total) by mouth 2 (two) times daily.     OVER THE COUNTER MEDICATION Take 1 tablet by mouth daily. Immune Plus     pregabalin (LYRICA) 25 MG capsule Take 1 capsule (25 mg total) by mouth 2 (two) times daily. (Patient taking differently: Take 50 mg by mouth 2 (two) times daily.) 60 capsule 2   Saline (SIMPLY SALINE) 0.9 % AERS Place 1 spray into the nose every morning.     rosuvastatin (CRESTOR) 20 MG tablet Take 1 tablet (20 mg total) by mouth daily. 90 tablet 3   No current facility-administered medications for this visit.    Physical Exam BP 120/74   Pulse 88   Resp 20  Ht 5\' 9"  (1.753 m)   Wt 205 lb (93 kg)   SpO2 95% Comment: RA  BMI 30.28 kg/m  70 year old man in no acute distress Alert and oriented x3 .  Decreased sensation fourth fifth fingers right hand Cardiac regular rate and rhythm no murmur Lungs clear with equal breath sounds bilaterally Sternal incision healing well   Diagnostic Tests: Echocardiogram 05/13/2021 IMPRESSIONS     1. Left ventricular ejection fraction, by estimation, is 30 to 35%. The  left ventricle has moderately decreased function. The left ventricle  demonstrates regional wall motion abnormalities (apical hypokinesis and  abnormal septal motion). Left  ventricular diastolic parameters are indeterminate.   2. Right ventricular systolic function is low normal. The right  ventricular size is normal. Tricuspid regurgitation signal is inadequate  for  assessing PA pressure.   3. The mitral valve has been replaced by a 32 mm Medtronic SimuForm ring-  procedure date 01/12/21. Mild mitral valve regurgitation. The mean mitral  valve gradient is 4.0 mmHg with average heart rate of 84 bpm. Normal  mitral prosthetic parameters   4. Prosthetic parameters: PHT 115 ms, TVI 2.1, EOAi 1.4 cm2, mean  gradient 4 mmHg.   5. The aortic valve is tricuspid. Aortic valve regurgitation is not  visualized. No aortic stenosis is present.   6. Aortic dilatation noted. There is mild dilatation of the aortic root,  measuring 41 mm. There is mild dilatation of the ascending aorta,  measuring 41 mm.   7. The inferior vena cava is normal in size with greater than 50%  respiratory variability, suggesting right atrial pressure of 3 mmHg.   Comparison(s): A prior study was performed on 01/10/21. TEE 01/10/21 EF  20-30%. MV 2mmHg mean PG.   Impression: Jonathan [name] is a 70 year old man with multiple cardiac risk factors who presented with heart failure.  He was found to have severe three-vessel disease.  He had a low ejection fraction of 20 to 30% with severe MR by intraoperative echo.  He underwent coronary bypass grafting and mitral valve repair back in June.  CAD-no anginal symptoms.  Does have type 2 diabetes and really presented with heart failure without chest pain initially.  Mitral regurgitation-dramatically improved from 4+ to 1+ postrepair.  Brachial plexopathy-I tried to figure out which medications he is on and whether he is actually taking the Lyrica or not.  I asked him to check on that.  He should have refills available and he can just call the pharmacy to get that.  If not we be happy to give him a new prescription but I did not want to give him prescription if the other is still active.  Plan: Follow-up with Dr. Martinique I will be happy to see Jonathan [name] back in the future if I can be of any assistance with his care  Melrose Nakayama, MD [facility] [phone]

## 2021-05-24 NOTE — Progress Notes (Signed)
      [address]       [place] [address]             [phone]     HPI:  Patient returns for routine postoperative follow-up having undergone  The patient's early postoperative recovery while in the hospital was notable for Since hospital discharge the patient reports   Current Outpatient Medications  Medication Sig Dispense Refill   albuterol (VENTOLIN HFA) 108 (90 Base) MCG/ACT inhaler Inhale 2 puffs into the lungs every 4 (four) hours as needed for shortness of breath or wheezing.     aspirin EC 81 MG EC tablet Take 1 tablet (81 mg total) by mouth daily. Swallow whole. 30 tablet 11   carvedilol (COREG) 6.25 MG tablet Take 1 tablet (6.25 mg total) by mouth 2 (two) times daily with a meal. 60 tablet 1   dapagliflozin propanediol (FARXIGA) 10 MG TABS tablet Take 1 tablet (10 mg total) by mouth daily before breakfast. 30 tablet 11   LORazepam (ATIVAN) 0.5 MG tablet Take 0.5 mg by mouth daily.     metFORMIN (GLUCOPHAGE) 500 MG tablet Take 1 tablet (500 mg total) by mouth 2 (two) times daily.     OVER THE COUNTER MEDICATION Take 1 tablet by mouth daily. Immune Plus     pregabalin (LYRICA) 25 MG capsule Take 1 capsule (25 mg total) by mouth 2 (two) times daily. (Patient taking differently: Take 50 mg by mouth 2 (two) times daily.) 60 capsule 2   Saline (SIMPLY SALINE) 0.9 % AERS Place 1 spray into the nose every morning.     rosuvastatin (CRESTOR) 20 MG tablet Take 1 tablet (20 mg total) by mouth daily. 90 tablet 3   No current facility-administered medications for this visit.    Physical Exam  Diagnostic Tests:   Impression:  Plan:   Melrose Nakayama, MD [facility] [phone]

## 2021-07-27 ENCOUNTER — Telehealth: Payer: Self-pay

## 2021-07-27 DIAGNOSIS — H2511 Age-related nuclear cataract, right eye: Secondary | ICD-10-CM | POA: Diagnosis not present

## 2021-07-27 NOTE — Telephone Encounter (Signed)
° °  Patient Name: Jonathan [name].  DOB: February 11, 1951 MRN: [id number]  Primary Cardiologist: Peter Martinique, MD  Chart reviewed as part of pre-operative protocol coverage. Cataract extractions are recognized in guidelines as low risk surgeries that do not typically require specific preoperative testing or holding of blood thinner therapy. Therefore, given past medical history and time since last visit, based on ACC/AHA guidelines, Jonathan [name]. would be at acceptable risk for the planned procedure without further cardiovascular testing.   I will route this recommendation to the requesting party via Epic fax function and remove from pre-op pool.  Please call with questions.  Emmaline Life, NP-C    07/27/2021, 3:55 PM [place] [address] Office [phone] Fax [phone]

## 2021-07-27 NOTE — Telephone Encounter (Signed)
° °  Pre-operative Risk Assessment    Patient Name: Jonathan [name].  DOB: September 22, 1950 MRN: [id number]      Request for Surgical Clearance    Procedure:   Cataract Extraction by PE, IOL-Left  Date of Surgery:  Clearance 08/08/21                                 Surgeon:  Dr. Tama High Surgeon's Group or Practice Name:  [facility] Phone number:  [phone] ext. [place] Fax number:  [phone]   Type of Clearance Requested:   - Medical    Type of Anesthesia:   IV Sedation   Additional requests/questions:    Signed, Jacqulynn Cadet   07/27/2021, 3:43 PM

## 2021-08-08 DIAGNOSIS — H2512 Age-related nuclear cataract, left eye: Secondary | ICD-10-CM | POA: Diagnosis not present

## 2021-08-22 DIAGNOSIS — H2511 Age-related nuclear cataract, right eye: Secondary | ICD-10-CM | POA: Diagnosis not present

## 2021-08-29 DIAGNOSIS — Z23 Encounter for immunization: Secondary | ICD-10-CM | POA: Diagnosis not present

## 2021-08-29 DIAGNOSIS — Z7984 Long term (current) use of oral hypoglycemic drugs: Secondary | ICD-10-CM | POA: Diagnosis not present

## 2021-08-29 DIAGNOSIS — E119 Type 2 diabetes mellitus without complications: Secondary | ICD-10-CM | POA: Diagnosis not present

## 2021-08-29 DIAGNOSIS — Z0000 Encounter for general adult medical examination without abnormal findings: Secondary | ICD-10-CM | POA: Diagnosis not present

## 2022-01-18 ENCOUNTER — Other Ambulatory Visit: Payer: Self-pay | Admitting: Cardiology

## 2022-01-18 DIAGNOSIS — Z9889 Other specified postprocedural states: Secondary | ICD-10-CM

## 2022-02-27 DIAGNOSIS — I10 Essential (primary) hypertension: Secondary | ICD-10-CM | POA: Diagnosis not present

## 2022-02-27 DIAGNOSIS — E1165 Type 2 diabetes mellitus with hyperglycemia: Secondary | ICD-10-CM | POA: Diagnosis not present

## 2022-02-27 DIAGNOSIS — E1169 Type 2 diabetes mellitus with other specified complication: Secondary | ICD-10-CM | POA: Diagnosis not present

## 2022-02-27 DIAGNOSIS — G8929 Other chronic pain: Secondary | ICD-10-CM | POA: Diagnosis not present

## 2022-03-14 DIAGNOSIS — E1165 Type 2 diabetes mellitus with hyperglycemia: Secondary | ICD-10-CM | POA: Diagnosis not present

## 2022-03-14 DIAGNOSIS — I10 Essential (primary) hypertension: Secondary | ICD-10-CM | POA: Diagnosis not present

## 2022-03-14 DIAGNOSIS — E1169 Type 2 diabetes mellitus with other specified complication: Secondary | ICD-10-CM | POA: Diagnosis not present

## 2022-03-14 DIAGNOSIS — I509 Heart failure, unspecified: Secondary | ICD-10-CM | POA: Diagnosis not present

## 2022-03-14 DIAGNOSIS — G8929 Other chronic pain: Secondary | ICD-10-CM | POA: Diagnosis not present

## 2022-05-01 DIAGNOSIS — E1169 Type 2 diabetes mellitus with other specified complication: Secondary | ICD-10-CM | POA: Diagnosis not present

## 2022-05-01 DIAGNOSIS — E1165 Type 2 diabetes mellitus with hyperglycemia: Secondary | ICD-10-CM | POA: Diagnosis not present

## 2022-09-04 DIAGNOSIS — M25569 Pain in unspecified knee: Secondary | ICD-10-CM | POA: Diagnosis not present

## 2022-09-04 DIAGNOSIS — M1990 Unspecified osteoarthritis, unspecified site: Secondary | ICD-10-CM | POA: Diagnosis not present

## 2022-09-04 DIAGNOSIS — I700 Atherosclerosis of aorta: Secondary | ICD-10-CM | POA: Diagnosis not present

## 2022-09-04 DIAGNOSIS — E1140 Type 2 diabetes mellitus with diabetic neuropathy, unspecified: Secondary | ICD-10-CM | POA: Diagnosis not present

## 2022-09-04 DIAGNOSIS — I509 Heart failure, unspecified: Secondary | ICD-10-CM | POA: Diagnosis not present

## 2022-09-04 DIAGNOSIS — Z0000 Encounter for general adult medical examination without abnormal findings: Secondary | ICD-10-CM | POA: Diagnosis not present

## 2022-09-04 DIAGNOSIS — I714 Abdominal aortic aneurysm, without rupture, unspecified: Secondary | ICD-10-CM | POA: Diagnosis not present

## 2022-09-04 DIAGNOSIS — J439 Emphysema, unspecified: Secondary | ICD-10-CM | POA: Diagnosis not present

## 2022-09-04 DIAGNOSIS — G629 Polyneuropathy, unspecified: Secondary | ICD-10-CM | POA: Diagnosis not present

## 2022-09-05 ENCOUNTER — Other Ambulatory Visit (HOSPITAL_BASED_OUTPATIENT_CLINIC_OR_DEPARTMENT_OTHER): Payer: Self-pay | Admitting: Family Medicine

## 2022-09-05 DIAGNOSIS — I700 Atherosclerosis of aorta: Secondary | ICD-10-CM

## 2022-09-12 DIAGNOSIS — M25562 Pain in left knee: Secondary | ICD-10-CM | POA: Diagnosis not present

## 2022-09-12 DIAGNOSIS — M25561 Pain in right knee: Secondary | ICD-10-CM | POA: Diagnosis not present

## 2022-09-14 ENCOUNTER — Ambulatory Visit: Payer: 59 | Admitting: Nurse Practitioner

## 2022-09-14 NOTE — Progress Notes (Deleted)
Office Visit    Patient Name: Jonathan [name]. Date of Encounter: 09/14/2022  Primary Care Provider:  Carolee Rota, NP Primary Cardiologist:  Peter Martinique, MD  Chief Complaint    72 year old male with a history of CAD s/p CABG x 4 (LIMA-LAD, SVG-OM, SVG-dRCA, SVG-diagonal) in [id number], chronic systolic heart failure, ICM, severe mitral valve regurgitation s/p mitral valve repair in 12/2020, hypertension, hyperlipidemia, type 2 diabetes, and former tobacco use who presents for follow-up related to CAD.  Past Medical History    Past Medical History:  Diagnosis Date   Cancer ([place])    skin on nose   CHF (congestive heart failure) ([place])    Coronary artery disease    Diabetes mellitus without complication ([place])    History of kidney stones    Hyperlipidemia    Hypertension    Kidney stones    Sciatica    Past Surgical History:  Procedure Laterality Date   BACK SURGERY     CHEST EXPLORATION  01/10/2021   Procedure: CHEST EXPLORATION FOR RETAINED FOREIGN OBJECT;  Surgeon: Melrose Nakayama, MD;  Location: [facility];  Service: Open Heart Surgery;;   COLONOSCOPY     CORONARY ARTERY BYPASS GRAFT N/A 01/10/2021   Procedure: CORONARY ARTERY BYPASS GRAFTING (CABG) TIMES FOUR, ON PUMP, USING LEFT INTERNAL MAMMARY ARTERY AND ENDOSCOPICALLY HARVESTED LEG VEIN;  Surgeon: Melrose Nakayama, MD;  Location: [facility];  Service: Open Heart Surgery;  Laterality: N/A;   ENDOVEIN HARVEST OF GREATER SAPHENOUS VEIN Right 01/10/2021   Procedure: ENDOVEIN HARVEST OF GREATER SAPHENOUS VEIN;  Surgeon: Melrose Nakayama, MD;  Location: [facility];  Service: Open Heart Surgery;  Laterality: Right;   LITHOTRIPSY     MITRAL VALVE REPAIR  01/10/2021   Procedure: MITRAL VALVE REPAIR (MVR) USING MEDTRONIC SIMUFORM 32MM RING;  Surgeon: Melrose Nakayama, MD;  Location: [facility];  Service: Open Heart Surgery;;   NOSE SURGERY Left 02/28/2017   skin reconstruction of nose from skin cancer   RIGHT/LEFT HEART CATH AND  CORONARY ANGIOGRAPHY N/A 12/30/2020   Procedure: RIGHT/LEFT HEART CATH AND CORONARY ANGIOGRAPHY;  Surgeon: Martinique, Peter M, MD;  Location: [facility];  Service: Cardiovascular;  Laterality: N/A;   TEE WITHOUT CARDIOVERSION N/A 01/10/2021   Procedure: TRANSESOPHAGEAL ECHOCARDIOGRAM (TEE);  Surgeon: Melrose Nakayama, MD;  Location: [facility];  Service: Open Heart Surgery;  Laterality: N/A;    Allergies  No Known Allergies   Labs/Other Studies Reviewed    The following studies were reviewed today: R/LHC 12/2020: Ost LM lesion is 35% stenosed. Prox LAD lesion is 90% stenosed. Mid LAD lesion is 70% stenosed. 1st Diag lesion is 50% stenosed. Prox Cx to Mid Cx lesion is 99% stenosed. Prox RCA lesion is 70% stenosed. LV end diastolic pressure is normal.   1. Severe 3 vessel obstructive CAD 2. Normal LV filling pressures 3. Normal right heart pressures.  4. Normal cardiac output   Plan: recommend referral to CT surgery for CABG. He has good targets. Given LV dysfunction, DM, and multivessel CAD his best option is CABG.   Echo 04/2021: IMPRESSIONS    1. Left ventricular ejection fraction, by estimation, is 30 to 35%. The  left ventricle has moderately decreased function. The left ventricle  demonstrates regional wall motion abnormalities (apical hypokinesis and  abnormal septal motion). Left  ventricular diastolic parameters are indeterminate.   2. Right ventricular systolic function is low normal. The right  ventricular size is normal. Tricuspid regurgitation signal is inadequate  for assessing PA pressure.   3. The mitral valve has been replaced by a 32 mm Medtronic SimuForm ring-  procedure date 01/12/21. Mild mitral valve regurgitation. The mean mitral  valve gradient is 4.0 mmHg with average heart rate of 84 bpm. Normal  mitral prosthetic parameters   4. Prosthetic parameters: PHT 115 ms, TVI 2.1, EOAi 1.4 cm2, mean  gradient 4 mmHg.   5. The aortic valve is tricuspid.  Aortic valve regurgitation is not  visualized. No aortic stenosis is present.   6. Aortic dilatation noted. There is mild dilatation of the aortic root,  measuring 41 mm. There is mild dilatation of the ascending aorta,  measuring 41 mm.   7. The inferior vena cava is normal in size with greater than 50%  respiratory variability, suggesting right atrial pressure of 3 mmHg.   Comparison(s): A prior study was performed on 01/10/21. TEE 01/10/21 EF  20-30%. MV 80mHg mean PG.   Recent Labs: No results found for requested labs within last 365 days.  Recent Lipid Panel    Component Value Date/Time   CHOL 211 (H) 12/22/2020 1546   TRIG 226 (H) 12/22/2020 1546   HDL 37 (L) 12/22/2020 1546   CHOLHDL 5.7 (H) 12/22/2020 1546   LDLCALC 133 (H) 12/22/2020 1546    History of Present Illness    72year old male with the above past medical history including CAD s/p CABG x 4 (LIMA-LAD, SVG-OM, SVG-dRCA, SVG-diagonal) in [id number] chronic systolic heart failure, ICM, severe mitral valve regurgitation s/p mitral valve repair in 12/2020, hypertension, hyperlipidemia, type 2 diabetes, and former tobacco use.  He was initially evaluated by Dr. JMartiniquein 11/2020 in the setting of new CHF.  [facility].  Echocardiogram showed moderate to severe LV enlargement, EF 25 to 30%, moderate MR, LAE, normal RV function, G1 DD.  He was last seen in the office on 12/22/2020 and noted intermittent chest discomfort, increased shortness of breath and exhaustion.  Cardiac catheterization 12/2020 revealed severe three-vessel obstructive CAD.  He was referred to CT surgery and underwent CABG x 4 (LIMA-LAD, SVG-OM, SVG-dRCA, SVG-diagonal) in 12/2020.  Intraoperative TEE revealed severe mitral valve regurgitation.  He underwent concurrent mitral valve repair at the time of his bypass surgery.  Most recent echocardiogram in 04/2021 showed EF 30 to 35%, moderately decreased LV function, apical  hypokinesis and abnormal septal motion, low normal RV systolic function, mild mitral valve regurgitation with normally functioning mitral prosthetic parameters, mild dilation of ascending aorta measuring 41 mm.  He has not been seen in the office since the time of his surgery.  He presents today for follow-up.  Since his surgery  CAD: Mitral valve regurgitation: Chronic systolic heart failure/ICM: Hypertension: Hyperlipidemia: Type 2 diabetes: Disposition:  Home Medications    Current Outpatient Medications  Medication Sig Dispense Refill   albuterol (VENTOLIN HFA) 108 (90 Base) MCG/ACT inhaler Inhale 2 puffs into the lungs every 4 (four) hours as needed for shortness of breath or wheezing.     aspirin EC 81 MG EC tablet Take 1 tablet (81 mg total) by mouth daily. Swallow whole. 30 tablet 11   carvedilol (COREG) 6.25 MG tablet Take 1 tablet (6.25 mg total) by mouth 2 (two) times daily with a meal. 60 tablet 1   dapagliflozin propanediol (FARXIGA) 10 MG TABS tablet Take 1 tablet (10 mg total) by mouth daily before breakfast. 30 tablet 11   LORazepam (ATIVAN) 0.5 MG tablet Take 0.5  mg by mouth daily.     metFORMIN (GLUCOPHAGE) 500 MG tablet Take 1 tablet (500 mg total) by mouth 2 (two) times daily.     OVER THE COUNTER MEDICATION Take 1 tablet by mouth daily. Immune Plus     pregabalin (LYRICA) 25 MG capsule Take 1 capsule (25 mg total) by mouth 2 (two) times daily. (Patient taking differently: Take 50 mg by mouth 2 (two) times daily.) 60 capsule 2   rosuvastatin (CRESTOR) 20 MG tablet Take 1 tablet (20 mg total) by mouth daily. 90 tablet 3   Saline (SIMPLY SALINE) 0.9 % AERS Place 1 spray into the nose every morning.     No current facility-administered medications for this visit.     Review of Systems    ***.  All other systems reviewed and are otherwise negative except as noted above.    Physical Exam    VS:  There were no vitals taken for this visit. , BMI There is no height or  weight on file to calculate BMI.     GEN: Well nourished, well developed, in no acute distress. HEENT: normal. Neck: Supple, no JVD, carotid bruits, or masses. Cardiac: RRR, no murmurs, rubs, or gallops. No clubbing, cyanosis, edema.  Radials/DP/PT 2+ and equal bilaterally.  Respiratory:  Respirations regular and unlabored, clear to auscultation bilaterally. GI: Soft, nontender, nondistended, BS + x 4. MS: no deformity or atrophy. Skin: warm and dry, no rash. Neuro:  Strength and sensation are intact. Psych: Normal affect.  Accessory Clinical Findings    ECG personally reviewed by me today - *** - no acute changes.   Lab Results  Component Value Date   WBC 9.2 01/13/2021   HGB 7.8 (L) 01/13/2021   HCT 23.4 (L) 01/13/2021   MCV 95.1 01/13/2021   PLT 77 (L) 01/13/2021   Lab Results  Component Value Date   CREATININE 0.79 01/13/2021   BUN 20 01/13/2021   NA 138 01/13/2021   K 3.5 01/13/2021   CL 106 01/13/2021   CO2 26 01/13/2021   Lab Results  Component Value Date   ALT 35 01/07/2021   AST 27 01/07/2021   ALKPHOS 37 (L) 01/07/2021   BILITOT 0.7 01/07/2021   Lab Results  Component Value Date   CHOL 211 (H) 12/22/2020   HDL 37 (L) 12/22/2020   LDLCALC 133 (H) 12/22/2020   TRIG 226 (H) 12/22/2020   CHOLHDL 5.7 (H) 12/22/2020    Lab Results  Component Value Date   HGBA1C 8.5 (H) 01/07/2021    Assessment & Plan    1.  ***  No BP recorded.  {Refresh Note OR Click here to enter BP  :1}***   Lenna Sciara, NP 09/14/2022, 6:29 AM

## 2022-09-26 NOTE — Progress Notes (Unsigned)
Cardiology Clinic Note   Patient Name: Jonathan [name]. Date of Encounter: 09/28/2022  Primary Care Provider:  Carolee Rota, NP Primary Cardiologist:  Peter Martinique, MD  Patient Profile    Jonathan [name]. 72 year old male presents to the clinic today for follow-up evaluation of his coronary artery disease and systolic CHF.  Past Medical History    Past Medical History:  Diagnosis Date   Cancer ([place])    skin on nose   CHF (congestive heart failure) ([place])    Coronary artery disease    Diabetes mellitus without complication ([place])    History of kidney stones    Hyperlipidemia    Hypertension    Kidney stones    Sciatica    Past Surgical History:  Procedure Laterality Date   BACK SURGERY     CHEST EXPLORATION  01/10/2021   Procedure: CHEST EXPLORATION FOR RETAINED FOREIGN OBJECT;  Surgeon: Melrose Nakayama, MD;  Location: [place];  Service: Open Heart Surgery;;   COLONOSCOPY     CORONARY ARTERY BYPASS GRAFT N/A 01/10/2021   Procedure: CORONARY ARTERY BYPASS GRAFTING (CABG) TIMES FOUR, ON PUMP, USING LEFT INTERNAL MAMMARY ARTERY AND ENDOSCOPICALLY HARVESTED LEG VEIN;  Surgeon: Melrose Nakayama, MD;  Location: [place];  Service: Open Heart Surgery;  Laterality: N/A;   ENDOVEIN HARVEST OF GREATER SAPHENOUS VEIN Right 01/10/2021   Procedure: ENDOVEIN HARVEST OF GREATER SAPHENOUS VEIN;  Surgeon: Melrose Nakayama, MD;  Location: [place];  Service: Open Heart Surgery;  Laterality: Right;   LITHOTRIPSY     MITRAL VALVE REPAIR  01/10/2021   Procedure: MITRAL VALVE REPAIR (MVR) USING MEDTRONIC SIMUFORM 32MM RING;  Surgeon: Melrose Nakayama, MD;  Location: [place];  Service: Open Heart Surgery;;   NOSE SURGERY Left 02/28/2017   skin reconstruction of nose from skin cancer   RIGHT/LEFT HEART CATH AND CORONARY ANGIOGRAPHY N/A 12/30/2020   Procedure: RIGHT/LEFT HEART CATH AND CORONARY ANGIOGRAPHY;  Surgeon: Martinique, Peter M, MD;  Location: [facility];  Service:  Cardiovascular;  Laterality: N/A;   TEE WITHOUT CARDIOVERSION N/A 01/10/2021   Procedure: TRANSESOPHAGEAL ECHOCARDIOGRAM (TEE);  Surgeon: Melrose Nakayama, MD;  Location: [place];  Service: Open Heart Surgery;  Laterality: N/A;    Allergies  No Known Allergies  History of Present Illness    Jonathan [name]. has a PMH of systolic CHF, HTN, coronary artery disease, type 2 diabetes, HLD, CABG x 4, is status post mitral valve repair, tobacco dependence, chronic pain, and dyspnea.  Echocardiogram 2022 showed moderate-severe LV enlargement, EF 25-30%, moderate MR and LAE, normal RV function, G1 DD.    He was admitted 5/22 at [facility].  He was diagnosed with acute on chronic CHF exacerbation.  He diuresed 7 pounds.  He was discharged on lisinopril 2.5 mg daily.  He was referred to cardiology for evaluation.   He was seen in follow-up by Dr. Martinique 12/22/2020.  During that time he reported that he had not been feeling well for 2 months.  He noticed increasing shortness of breath and was feeling exhausted.  He woke up that week and noticed that he could not breathe.  He presented to the emergency department.  He did note some sharp pain in his chest with heartbeats.  He denied swelling, palpitations, dizziness.  He had been diagnosed with diabetes and hypertension 2022.  He is a former smoker.  He underwent cardiac catheterization 12/30/2020 showed severe three-vessel obstructive CAD.  He was referred to  cardiothoracic surgery.  He underwent CABG x 4 on 01/10/2021 by Dr. Roxan Hockey.  He also underwent mitral valve repair.  Echocardiogram 05/13/2021 showed an EF of 30-35%, intermediate diastolic parameters, repaired mitral valve with mild mitral valve regurgitation and mean gradient of 4 mmHg, normal prosthetic parameters.  He was lost to follow-up.  He presents to the clinic today for follow-up evaluation and states he has been having some increased work of breathing with increased physical activity  and weakness in his legs.  He had a COVID vaccination several weeks ago and after the vaccination he contracted an upper respiratory infection.  He required prednisone which has helped with his symptom improvement.  He is limited in his physical activity due to his lower extremity pain.  He does note occasional nonexertional brief chest discomfort.  His blood pressure is well-controlled at 108/68.  I will repeat an echocardiogram, continue daily weights, give salty 6 diet sheet and plan follow-up in 3 to 4 months.  Today he denies chest pain, lower extremity edema, fatigue, palpitations, melena, hematuria, hemoptysis, diaphoresis, weakness, presyncope, syncope, orthopnea, and PND.   Home Medications    Prior to Admission medications   Medication Sig Start Date End Date Taking? Authorizing Provider  albuterol (VENTOLIN HFA) 108 (90 Base) MCG/ACT inhaler Inhale 2 puffs into the lungs every 4 (four) hours as needed for shortness of breath or wheezing.    [provider]  aspirin EC 81 MG EC tablet Take 1 tablet (81 mg total) by mouth daily. Swallow whole. 01/15/21   Elgie Collard, PA-C  carvedilol (COREG) 6.25 MG tablet Take 1 tablet (6.25 mg total) by mouth 2 (two) times daily with a meal. 03/10/21   Pavero, Harrell Gave, RPH  dapagliflozin propanediol (FARXIGA) 10 MG TABS tablet Take 1 tablet (10 mg total) by mouth daily before breakfast. 12/22/20   Martinique, Peter M, MD  LORazepam (ATIVAN) 0.5 MG tablet Take 0.5 mg by mouth daily. 12/22/20   [provider]  metFORMIN (GLUCOPHAGE) 500 MG tablet Take 1 tablet (500 mg total) by mouth 2 (two) times daily. 01/02/21   Martinique, Peter M, MD  OVER THE COUNTER MEDICATION Take 1 tablet by mouth daily. Immune Plus    [provider]  pregabalin (LYRICA) 25 MG capsule Take 1 capsule (25 mg total) by mouth 2 (two) times daily. Patient taking differently: Take 50 mg by mouth 2 (two) times daily. 03/08/21   Melrose Nakayama, MD  rosuvastatin  (CRESTOR) 20 MG tablet Take 1 tablet (20 mg total) by mouth daily. 12/24/20 03/24/21  Martinique, Peter M, MD  Saline (SIMPLY SALINE) 0.9 % AERS Place 1 spray into the nose every morning.    [provider]    Family History    Family History  Problem Relation Age of Onset   Heart failure Mother    COPD Mother    Heart attack Father    He indicated that his mother is deceased. He indicated that his father is deceased. He indicated that all of his three sisters are alive. He indicated that both of his brothers are alive.  Social History    Social History   Socioeconomic History   Marital status: Married    Spouse name: Not on file   Number of children: Not on file   Years of education: Not on file   Highest education level: Not on file  Occupational History   Occupation: retired Museum/gallery curator  Tobacco Use   Smoking status: Former  Types: Cigarettes    Quit date: 12/22/2017    Years since quitting: 4.7   Smokeless tobacco: Never  Vaping Use   Vaping Use: Never used  Substance and Sexual Activity   Alcohol use: Yes    Alcohol/week: 24.0 standard drinks of alcohol    Types: 24 Cans of beer per week    Comment: 1 case a week   Drug use: Never   Sexual activity: Not on file  Other Topics Concern   Not on file  Social History Narrative   Not on file   Social Determinants of Health   Financial Resource Strain: Not on file  Food Insecurity: Not on file  Transportation Needs: Not on file  Physical Activity: Not on file  Stress: Not on file  Social Connections: Not on file  Intimate Partner Violence: Not on file     Review of Systems    General:  No chills, fever, night sweats or weight changes.  Cardiovascular:  No chest pain, dyspnea on exertion, edema, orthopnea, palpitations, paroxysmal nocturnal dyspnea. Dermatological: No rash, lesions/masses Respiratory: No cough, dyspnea Urologic: No hematuria, dysuria Abdominal:   No nausea, vomiting, diarrhea, bright red  blood per rectum, melena, or hematemesis Neurologic:  No visual changes, wkns, changes in mental status. All other systems reviewed and are otherwise negative except as noted above.  Physical Exam    VS:  BP 106/68   Ht '5\' 9"'$  (1.753 m)   Wt 188 lb 6.4 oz (85.5 kg)   SpO2 92%   BMI 27.82 kg/m  , BMI Body mass index is 27.82 kg/m. GEN: Well nourished, well developed, in no acute distress. HEENT: normal. Neck: Supple, no JVD, carotid bruits, or masses. Cardiac: RRR, no murmurs, rubs, or gallops. No clubbing, cyanosis, edema.  Radials/DP/PT 2+ and equal bilaterally.  Respiratory:  Respirations regular and unlabored, clear to auscultation bilaterally.  Productive cough GI: Soft, nontender, nondistended, BS + x 4. MS: no deformity or atrophy. Skin: warm and dry, no rash. Neuro:  Strength and sensation are intact. Psych: Normal affect.  Accessory Clinical Findings    Recent Labs: No results found for requested labs within last 365 days.   Recent Lipid Panel    Component Value Date/Time   CHOL 211 (H) 12/22/2020 1546   TRIG 226 (H) 12/22/2020 1546   HDL 37 (L) 12/22/2020 1546   CHOLHDL 5.7 (H) 12/22/2020 1546   LDLCALC 133 (H) 12/22/2020 1546         ECG personally reviewed by me today-normal sinus rhythm T wave abnormality consider inferior ischemia 79 bpm- No acute changes  Cardiac catheterization 12/30/2020 Ost LM lesion is 35% stenosed. Prox LAD lesion is 90% stenosed. Mid LAD lesion is 70% stenosed. 1st Diag lesion is 50% stenosed. Prox Cx to Mid Cx lesion is 99% stenosed. Prox RCA lesion is 70% stenosed. LV end diastolic pressure is normal.   1. Severe 3 vessel obstructive CAD 2. Normal LV filling pressures 3. Normal right heart pressures.  4. Normal cardiac output   Plan: recommend referral to CT surgery for CABG. He has good targets. Given LV dysfunction, DM, and multivessel CAD his best option is CABG.     Echocardiogram 05/13/2021  IMPRESSIONS     1.  Left ventricular ejection fraction, by estimation, is 30 to 35%. The  left ventricle has moderately decreased function. The left ventricle  demonstrates regional wall motion abnormalities (apical hypokinesis and  abnormal septal motion). Left  ventricular diastolic parameters are indeterminate.  2. Right ventricular systolic function is low normal. The right  ventricular size is normal. Tricuspid regurgitation signal is inadequate  for assessing PA pressure.   3. The mitral valve has been replaced by a 32 mm Medtronic SimuForm ring-  procedure date 01/12/21. Mild mitral valve regurgitation. The mean mitral  valve gradient is 4.0 mmHg with average heart rate of 84 bpm. Normal  mitral prosthetic parameters   4. Prosthetic parameters: PHT 115 ms, TVI 2.1, EOAi 1.4 cm2, mean  gradient 4 mmHg.   5. The aortic valve is tricuspid. Aortic valve regurgitation is not  visualized. No aortic stenosis is present.   6. Aortic dilatation noted. There is mild dilatation of the aortic root,  measuring 41 mm. There is mild dilatation of the ascending aorta,  measuring 41 mm.   7. The inferior vena cava is normal in size with greater than 50%  respiratory variability, suggesting right atrial pressure of 3 mmHg.   Comparison(s): A prior study was performed on 01/10/21. TEE 01/10/21 EF  20-30%. MV 93mHg mean PG.   FINDINGS   Left Ventricle: Left ventricular ejection fraction, by estimation, is 30  to 35%. The left ventricle has moderately decreased function. The left  ventricle demonstrates regional wall motion abnormalities. The left  ventricular internal cavity size was  normal in size. There is no left ventricular hypertrophy. Left ventricular  diastolic parameters are indeterminate.     LV Wall Scoring:  The inferior septum, entire apex, and entire inferior wall are  hypokinetic.   Right Ventricle: The right ventricular size is normal. No increase in  right ventricular wall thickness. Right  ventricular systolic function is  low normal. Tricuspid regurgitation signal is inadequate for assessing PA  pressure.   Left Atrium: Left atrial size was normal in size.   Right Atrium: Right atrial size was normal in size.   Pericardium: There is no evidence of pericardial effusion.   Mitral Valve: The mitral valve has been repaired/replaced. Mild mitral  valve regurgitation. There is a 32 mm Medtronic SimuForm ring annuloplasty  present in the mitral position. Procedure Date: 01/12/21. MV peak gradient,  8.5 mmHg. The mean mitral valve  gradient is 4.0 mmHg with average heart rate of 84 bpm.   Tricuspid Valve: The tricuspid valve is normal in structure. Tricuspid  valve regurgitation is not demonstrated. No evidence of tricuspid  stenosis.   Aortic Valve: The aortic valve is tricuspid. Aortic valve regurgitation is  not visualized. No aortic stenosis is present.   Pulmonic Valve: The pulmonic valve was grossly normal. Pulmonic valve  regurgitation is not visualized. No evidence of pulmonic stenosis.   Aorta: Aortic dilatation noted. There is mild dilatation of the aortic  root, measuring 41 mm. There is mild dilatation of the ascending aorta,  measuring 41 mm.   Venous: The inferior vena cava is normal in size with greater than 50%  respiratory variability, suggesting right atrial pressure of 3 mmHg.   IAS/Shunts: The atrial septum is grossly normal.    Assessment & Plan   1.  Coronary artery disease-no chest pain today.  Status post CABG x 4.  Has returned to normal physical activity. Continue aspirin, rosuvastatin, carvedilol Heart healthy low-sodium diet-salty 6 given Increase physical activity as tolerated  Mitral valve insufficiency-status post mitral valve repair.  Echocardiogram 05/13/2021 showed well-functioning valve with EF of 30-35% and intermediate diastolic parameters. Repeat echocardiogram  Chronic systolic CHF-NYHA class II.  Weight stable.  Euvolemic.   Echocardiogram 10/22 showed  an EF of 30-35% with intermediate diastolic parameters.  Reports compliance with medical therapy. Continue carvedilol, Farxiga,  Hyperlipidemia-LDL 22 on 05/01/2022 Continue aspirin, rosuvastatin Heart healthy low-sodium high-fiber diet Increase physical activity as tolerated Repeat fasting lipids and LFTs  Tobacco dependence-strongly encouraged tobacco cessation. Tobacco cessation instructions/information given  Productive cough-received prednisone from his PCP.  Clear to auscultation bilaterally.  Continues with mucus production and coughing. Follow-up with PCP  Disposition: Follow-up with Dr. Martinique in 3-4 months.   Jossie Ng. Laureano Hetzer NP-C     09/28/2022, 9:36 AM [place] [address] Office [phone] Fax [phone]    I spent 14 minutes examining this patient, reviewing medications, and using patient centered shared decision making involving her cardiac care.  Prior to her visit I spent greater than 20 minutes reviewing her past medical history,  medications, and prior cardiac tests.

## 2022-09-28 ENCOUNTER — Encounter: Payer: Self-pay | Admitting: General Practice

## 2022-09-28 ENCOUNTER — Ambulatory Visit: Payer: 59 | Attending: Cardiology | Admitting: General Practice

## 2022-09-28 VITALS — BP 106/68 | Ht 69.0 in | Wt 188.4 lb

## 2022-09-28 DIAGNOSIS — E7800 Pure hypercholesterolemia, unspecified: Secondary | ICD-10-CM

## 2022-09-28 DIAGNOSIS — Z951 Presence of aortocoronary bypass graft: Secondary | ICD-10-CM

## 2022-09-28 DIAGNOSIS — I2510 Atherosclerotic heart disease of native coronary artery without angina pectoris: Secondary | ICD-10-CM | POA: Diagnosis not present

## 2022-09-28 DIAGNOSIS — Z9889 Other specified postprocedural states: Secondary | ICD-10-CM

## 2022-09-28 DIAGNOSIS — I5021 Acute systolic (congestive) heart failure: Secondary | ICD-10-CM

## 2022-09-28 DIAGNOSIS — R0609 Other forms of dyspnea: Secondary | ICD-10-CM | POA: Diagnosis not present

## 2022-09-28 DIAGNOSIS — Z720 Tobacco use: Secondary | ICD-10-CM | POA: Diagnosis not present

## 2022-09-28 NOTE — Patient Instructions (Signed)
Medication Instructions:  The current medical regimen is effective;  continue present plan and medications as directed. Please refer to the Current Medication list given to you today.  *If you need a refill on your cardiac medications before your next appointment, please call your pharmacy*   Lab Work: NONE If you have labs (blood work) drawn today and your tests are completely normal, you will receive your results only by: [facility] (if you have MyChart) OR A paper copy in the mail If you have any lab test that is abnormal or we need to change your treatment, we will call you to review the results.  Testing/Procedures: Echocardiogram - Your physician has requested that you have an echocardiogram. Echocardiography is a painless test that uses sound waves to create images of your heart. It provides your doctor with information about the size and shape of your heart and how well your heart's chambers and valves are working. This procedure takes approximately one hour. There are no restrictions for this procedure.    Follow-Up: At [facility], you and your health needs are our priority.  As part of our continuing mission to provide you with exceptional heart care, we have created designated Provider Care Teams.  These Care Teams include your primary Cardiologist (physician) and Advanced Practice Providers (APPs -  Physician Assistants and Nurse Practitioners) who all work together to provide you with the care you need, when you need it.  We recommend signing up for the patient portal called "MyChart".  Sign up information is provided on this After Visit Summary.  MyChart is used to connect with patients for Virtual Visits (Telemedicine).  Patients are able to view lab/test results, encounter notes, upcoming appointments, etc.  Non-urgent messages can be sent to your provider as well.   To learn more about what you can do with MyChart, go to [email or web address].    Your next  appointment:   3-4 month(s)  Provider:   Peter Martinique, MD     Other Instructions INCREASE PHYSICAL ACTIVITY AS TOLERATED  INCREASE HYDRATION

## 2022-10-26 ENCOUNTER — Other Ambulatory Visit (HOSPITAL_COMMUNITY): Payer: 59

## 2022-10-30 ENCOUNTER — Ambulatory Visit: Payer: Medicare HMO | Attending: General Practice

## 2022-10-30 DIAGNOSIS — R0609 Other forms of dyspnea: Secondary | ICD-10-CM | POA: Diagnosis not present

## 2022-10-30 DIAGNOSIS — I2510 Atherosclerotic heart disease of native coronary artery without angina pectoris: Secondary | ICD-10-CM | POA: Diagnosis not present

## 2022-10-31 LAB — ECHOCARDIOGRAM COMPLETE
AR max vel: 3.12 cm2
AV Area VTI: 2.78 cm2
AV Area mean vel: 3.26 cm2
AV Mean grad: 3 mmHg
AV Peak grad: 5.3 mmHg
Ao pk vel: 1.15 m/s
Area-P 1/2: 3.34 cm2
Calc EF: 36.9 %
MV M vel: 3.75 m/s
MV Peak grad: 56.1 mmHg
S' Lateral: 4.7 cm
Single Plane A2C EF: 37.2 %
Single Plane A4C EF: 37.2 %

## 2022-11-02 ENCOUNTER — Other Ambulatory Visit: Payer: Self-pay

## 2022-11-02 MED ORDER — EMPAGLIFLOZIN 25 MG PO TABS: 25.0000 mg | ORAL_TABLET | Freq: Every day | ORAL | Status: DC

## 2022-11-08 ENCOUNTER — Telehealth: Payer: Self-pay | Admitting: Cardiology

## 2022-11-08 NOTE — Telephone Encounter (Signed)
*  STAT* If patient is at the pharmacy, call can be transferred to refill team.   1. Which medications need to be refilled? (please list name of each medication and dose if known) carvedilol (COREG) 6.25 MG tablet   2. Which pharmacy/location (including street and city if local pharmacy) is medication to be sent to?  [id number] - [place], [place] - [address]    3. Do they need a 30 day or 90 day supply? 90 day

## 2022-11-10 MED ORDER — CARVEDILOL 6.25 MG PO TABS: 6.2500 mg | ORAL_TABLET | Freq: Two times a day (BID) | ORAL | 3 refills | Status: DC

## 2022-11-10 NOTE — Telephone Encounter (Signed)
Spoke with patient to advise that refill for carvedilol 6.25mg  [facility] [place]. Pt states he has been out of this medication for "a month or so" and had some elevated SBPs (up to the 180s) for "a few days" but his SBP has been in the 120s-140s this week. Encouraged pt to resume taking carvedilol 6.25mg  BID and continue checking BP daily. Pt agrees to monitor for BP >140/90 and call our office with concerns. Denies additional questions at this time.  Forwarding to J. Cleaver, NP, to confirm no new recommendations.

## 2022-12-19 ENCOUNTER — Ambulatory Visit: Payer: Medicare Other | Admitting: Cardiology

## 2023-01-07 NOTE — Progress Notes (Deleted)
Cardiology Clinic Note   Patient Name: Jonathan [name]. Date of Encounter: 01/07/2023  Primary Care Provider:  Joycelyn Rua, MD Primary Cardiologist:  Peter Swaziland, MD  Patient Profile    Jonathan [name]. 72 year old male presents to the clinic today for follow-up evaluation of his coronary artery disease and systolic CHF.  Past Medical History    Past Medical History:  Diagnosis Date   Cancer (HCC)    skin on nose   CHF (congestive heart failure) (HCC)    Coronary artery disease    Diabetes mellitus without complication (HCC)    History of kidney stones    Hyperlipidemia    Hypertension    Kidney stones    Sciatica    Past Surgical History:  Procedure Laterality Date   BACK SURGERY     CHEST EXPLORATION  01/10/2021   Procedure: CHEST EXPLORATION FOR RETAINED FOREIGN OBJECT;  Surgeon: Loreli Slot, MD;  Location: [facility] OR;  Service: Open Heart Surgery;;   COLONOSCOPY     CORONARY ARTERY BYPASS GRAFT N/A 01/10/2021   Procedure: CORONARY ARTERY BYPASS GRAFTING (CABG) TIMES FOUR, ON PUMP, USING LEFT INTERNAL MAMMARY ARTERY AND ENDOSCOPICALLY HARVESTED LEG VEIN;  Surgeon: Loreli Slot, MD;  Location: [facility] OR;  Service: Open Heart Surgery;  Laterality: N/A;   ENDOVEIN HARVEST OF GREATER SAPHENOUS VEIN Right 01/10/2021   Procedure: ENDOVEIN HARVEST OF GREATER SAPHENOUS VEIN;  Surgeon: Loreli Slot, MD;  Location: [facility] OR;  Service: Open Heart Surgery;  Laterality: Right;   LITHOTRIPSY     MITRAL VALVE REPAIR  01/10/2021   Procedure: MITRAL VALVE REPAIR (MVR) USING MEDTRONIC SIMUFORM RING;  Surgeon: Loreli Slot, MD;  Location: [facility] OR;  Service: Open Heart Surgery;;   NOSE SURGERY Left 02/28/2017   skin reconstruction of nose from skin cancer   RIGHT/LEFT HEART CATH AND CORONARY ANGIOGRAPHY N/A 12/30/2020   Procedure: RIGHT/LEFT HEART CATH AND CORONARY ANGIOGRAPHY;  Surgeon: Swaziland, Peter M, MD;  Location: [facility] INVASIVE CV LAB;  Service:  Cardiovascular;  Laterality: N/A;   TEE WITHOUT CARDIOVERSION N/A 01/10/2021   Procedure: TRANSESOPHAGEAL ECHOCARDIOGRAM (TEE);  Surgeon: Loreli Slot, MD;  Location: [facility] OR;  Service: Open Heart Surgery;  Laterality: N/A;    Allergies  No Known Allergies  History of Present Illness    Jonathan Nicholes. has a PMH of systolic CHF, HTN, coronary artery disease, type 2 diabetes, HLD, CABG x 4, is status post mitral valve repair, tobacco dependence, chronic pain, and dyspnea.  Echocardiogram 2022 showed moderate-severe LV enlargement, EF 25-30%, moderate MR and LAE, normal RV function, G1 DD.    He was admitted 5/22 at [facility].  He was diagnosed with acute on chronic CHF exacerbation.  He diuresed 7 pounds.  He was discharged on lisinopril 2.5 mg daily.  He was referred to cardiology for evaluation.   He was seen in follow-up by Dr. Swaziland 12/22/2020.  During that time he reported that he had not been feeling well for 2 months.  He noticed increasing shortness of breath and was feeling exhausted.  He woke up that week and noticed that he could not breathe.  He presented to the emergency department.  He did note some sharp pain in his chest with heartbeats.  He denied swelling, palpitations, dizziness.  He had been diagnosed with diabetes and hypertension 2022.  He is a former smoker.  He underwent cardiac catheterization 12/30/2020 showed severe three-vessel obstructive CAD.  He was referred to  cardiothoracic surgery.  He underwent CABG x 4 on 01/10/2021 by Dr. Dorris Fetch.  He also underwent mitral valve repair.  Echocardiogram 05/13/2021 showed an EF of 30-35%, intermediate diastolic parameters, repaired mitral valve with mild mitral valve regurgitation and mean gradient of 4 mmHg, normal prosthetic parameters.  He was lost to follow-up.  He presented to the clinic today for follow-up evaluation and stated he had been having some increased work of breathing with increased physical activity  and weakness in his legs.  He reported having a COVID vaccination several weeks prior and after the vaccination he contracted an upper respiratory infection.  He required prednisone which helped with his symptoms.  He was limited in his physical activity due to his lower extremity pain.  He did note occasional nonexertional brief chest discomfort.  His blood pressure was well-controlled at 108/68.  I planned repeat  echocardiogram, continued daily weights,  and planned follow-up in 3 to 4 months.  His echocardiogram 10/30/2022 showed an EF of 30-35% with intermediate diastolic parameters.  He was noted to have mild aortic root dilation measuring 43 mm.  He was started on losartan 25 mg daily.  He presents to the clinic today for follow-up evaluation and states***.  Today he denies chest pain, lower extremity edema, fatigue, palpitations, melena, hematuria, hemoptysis, diaphoresis, weakness, presyncope, syncope, orthopnea, and PND.  Chronic systolic CHF-NYHA class II.    Euvolemic.  Echocardiogram 10/22 showed an EF of 30-35% with intermediate diastolic parameters.  Follow-up echocardiogram 10/30/2022 showed EF of 30-35% and intermediate diastolic parameters with mild dilation of the aortic root measuring 43 mm. Continue carvedilol, Jardiance,  Increase losartan 50 mg daily Repeat BMP in 2 weeks  Coronary artery disease-denies recent episodes of chest discomfort.  Status post CABG x 4.  Continues to be somewhat physically active. Continue aspirin, rosuvastatin, carvedilol Heart healthy low-sodium diet-salty 6 reviewed Maintain physical activity  Mitral valve insufficiency-status post mitral valve repair.  Echocardiogram 05/13/2021 showed well-functioning valve with EF of 30-35% and intermediate diastolic parameters.  Echocardiogram 10/30/2022 showed trivial mitral valve regurgitation   Hyperlipidemia-LDL 22 on 05/01/2022 Continue aspirin, rosuvastatin Heart healthy low-sodium high-fiber diet Increase  physical activity as tolerated Repeat fasting lipids and LFTs  Tobacco dependence-continues to smoke.  Smoking cessation strongly encouraged. Tobacco cessation instructions/information given/reviewed   Disposition: Follow-up with Dr. Swaziland or me in 1-2 months.  Home Medications    Prior to Admission medications   Medication Sig Start Date End Date Taking? Authorizing Provider  albuterol (VENTOLIN HFA) 108 (90 Base) MCG/ACT inhaler Inhale 2 puffs into the lungs every 4 (four) hours as needed for shortness of breath or wheezing.    [provider]  aspirin EC 81 MG EC tablet Take 1 tablet (81 mg total) by mouth daily. Swallow whole. 01/15/21   Sharlene Dory, PA-C  carvedilol (COREG) 6.25 MG tablet Take 1 tablet (6.25 mg total) by mouth 2 (two) times daily with a meal. 03/10/21   Pavero, Cristal Deer, RPH  dapagliflozin propanediol (FARXIGA) 10 MG TABS tablet Take 1 tablet (10 mg total) by mouth daily before breakfast. 12/22/20   Swaziland, Peter M, MD  LORazepam (ATIVAN) 0.5 MG tablet Take 0.5 mg by mouth daily. 12/22/20   [provider]  metFORMIN (GLUCOPHAGE) 500 MG tablet Take 1 tablet (500 mg total) by mouth 2 (two) times daily. 01/02/21   Swaziland, Peter M, MD  OVER THE COUNTER MEDICATION Take 1 tablet by mouth daily. Immune Plus    [provider]  pregabalin (LYRICA) 25 MG capsule Take 1 capsule (25 mg total) by mouth 2 (two) times daily. Patient taking differently: Take 50 mg by mouth 2 (two) times daily. 03/08/21   Loreli Slot, MD  rosuvastatin (CRESTOR) 20 MG tablet Take 1 tablet (20 mg total) by mouth daily. 12/24/20 03/24/21  Swaziland, Peter M, MD  Saline (SIMPLY SALINE) 0.9 % AERS Place 1 spray into the nose every morning.    [provider]    Family History    Family History  Problem Relation Age of Onset   Heart failure Mother    COPD Mother    Heart attack Father    He indicated that his mother is deceased. He indicated that his father  is deceased. He indicated that all of his three sisters are alive. He indicated that both of his brothers are alive.  Social History    Social History   Socioeconomic History   Marital status: Married    Spouse name: Not on file   Number of children: Not on file   Years of education: Not on file   Highest education level: Not on file  Occupational History   Occupation: retired Proofreader  Tobacco Use   Smoking status: Former    Types: Cigarettes    Quit date: 12/22/2017    Years since quitting: 5.0   Smokeless tobacco: Never  Vaping Use   Vaping Use: Never used  Substance and Sexual Activity   Alcohol use: Yes    Alcohol/week: 24.0 standard drinks of alcohol    Types: 24 Cans of beer per week    Comment: 1 case a week   Drug use: Never   Sexual activity: Not on file  Other Topics Concern   Not on file  Social History Narrative   Not on file   Social Determinants of Health   Financial Resource Strain: Not on file  Food Insecurity: Not on file  Transportation Needs: Not on file  Physical Activity: Not on file  Stress: Not on file  Social Connections: Not on file  Intimate Partner Violence: Not on file     Review of Systems    General:  No chills, fever, night sweats or weight changes.  Cardiovascular:  No chest pain, dyspnea on exertion, edema, orthopnea, palpitations, paroxysmal nocturnal dyspnea. Dermatological: No rash, lesions/masses Respiratory: No cough, dyspnea Urologic: No hematuria, dysuria Abdominal:   No nausea, vomiting, diarrhea, bright red blood per rectum, melena, or hematemesis Neurologic:  No visual changes, wkns, changes in mental status. All other systems reviewed and are otherwise negative except as noted above.  Physical Exam    VS:  There were no vitals taken for this visit. , BMI There is no height or weight on file to calculate BMI. GEN: Well nourished, well developed, in no acute distress. HEENT: normal. Neck: Supple, no JVD, carotid  bruits, or masses. Cardiac: RRR, no murmurs, rubs, or gallops. No clubbing, cyanosis, edema.  Radials/DP/PT 2+ and equal bilaterally.  Respiratory:  Respirations regular and unlabored, clear to auscultation bilaterally.  Productive cough*** GI: Soft, nontender, nondistended, BS + x 4. MS: no deformity or atrophy. Skin: warm and dry, no rash. Neuro:  Strength and sensation are intact. Psych: Normal affect.  Accessory Clinical Findings    Recent Labs: No results found for requested labs within last 365 days.   Recent Lipid Panel    Component Value Date/Time   CHOL 211 (H) 12/22/2020 1546   TRIG 226 (H) 12/22/2020 1546  HDL 37 (L) 12/22/2020 1546   CHOLHDL 5.7 (H) 12/22/2020 1546   LDLCALC 133 (H) 12/22/2020 1546    No BP recorded.  {Refresh Note OR Click here to enter BP  :1}***    ECG personally reviewed by me today-none today.  EKG 09/28/22  normal sinus rhythm T wave abnormality consider inferior ischemia 79 bpm- No acute changes  Cardiac catheterization 12/30/2020 Ost LM lesion is 35% stenosed. Prox LAD lesion is 90% stenosed. Mid LAD lesion is 70% stenosed. 1st Diag lesion is 50% stenosed. Prox Cx to Mid Cx lesion is 99% stenosed. Prox RCA lesion is 70% stenosed. LV end diastolic pressure is normal.   1. Severe 3 vessel obstructive CAD 2. Normal LV filling pressures 3. Normal right heart pressures.  4. Normal cardiac output   Plan: recommend referral to CT surgery for CABG. He has good targets. Given LV dysfunction, DM, and multivessel CAD his best option is CABG.     Echocardiogram 05/13/2021  IMPRESSIONS     1. Left ventricular ejection fraction, by estimation, is 30 to 35%. The  left ventricle has moderately decreased function. The left ventricle  demonstrates regional wall motion abnormalities (apical hypokinesis and  abnormal septal motion). Left  ventricular diastolic parameters are indeterminate.   2. Right ventricular systolic function is low  normal. The right  ventricular size is normal. Tricuspid regurgitation signal is inadequate  for assessing PA pressure.   3. The mitral valve has been replaced by a 32 mm Medtronic SimuForm ring-  procedure date 01/12/21. Mild mitral valve regurgitation. The mean mitral  valve gradient is 4.0 mmHg with average heart rate of 84 bpm. Normal  mitral prosthetic parameters   4. Prosthetic parameters: PHT 115 ms, TVI 2.1, EOAi 1.4 cm2, mean  gradient 4 mmHg.   5. The aortic valve is tricuspid. Aortic valve regurgitation is not  visualized. No aortic stenosis is present.   6. Aortic dilatation noted. There is mild dilatation of the aortic root,  measuring 41 mm. There is mild dilatation of the ascending aorta,  measuring 41 mm.   7. The inferior vena cava is normal in size with greater than 50%  respiratory variability, suggesting right atrial pressure of 3 mmHg.   Comparison(s): A prior study was performed on 01/10/21. TEE 01/10/21 EF  20-30%. MV mean PG.   FINDINGS   Left Ventricle: Left ventricular ejection fraction, by estimation, is 30  to 35%. The left ventricle has moderately decreased function. The left  ventricle demonstrates regional wall motion abnormalities. The left  ventricular internal cavity size was  normal in size. There is no left ventricular hypertrophy. Left ventricular  diastolic parameters are indeterminate.     LV Wall Scoring:  The inferior septum, entire apex, and entire inferior wall are  hypokinetic.   Right Ventricle: The right ventricular size is normal. No increase in  right ventricular wall thickness. Right ventricular systolic function is  low normal. Tricuspid regurgitation signal is inadequate for assessing PA  pressure.   Left Atrium: Left atrial size was normal in size.   Right Atrium: Right atrial size was normal in size.   Pericardium: There is no evidence of pericardial effusion.   Mitral Valve: The mitral valve has been  repaired/replaced. Mild mitral  valve regurgitation. There is a 32 mm Medtronic SimuForm ring annuloplasty  present in the mitral position. Procedure Date: 01/12/21. MV peak gradient,  8.5 mmHg. The mean mitral valve  gradient is 4.0 mmHg with average heart  rate of 84 bpm.   Tricuspid Valve: The tricuspid valve is normal in structure. Tricuspid  valve regurgitation is not demonstrated. No evidence of tricuspid  stenosis.   Aortic Valve: The aortic valve is tricuspid. Aortic valve regurgitation is  not visualized. No aortic stenosis is present.   Pulmonic Valve: The pulmonic valve was grossly normal. Pulmonic valve  regurgitation is not visualized. No evidence of pulmonic stenosis.   Aorta: Aortic dilatation noted. There is mild dilatation of the aortic  root, measuring 41 mm. There is mild dilatation of the ascending aorta,  measuring 41 mm.   Venous: The inferior vena cava is normal in size with greater than 50%  respiratory variability, suggesting right atrial pressure of 3 mmHg.   IAS/Shunts: The atrial septum is grossly normal.   Echocardiogram 10/30/2022  IMPRESSIONS     1. Left ventricular ejection fraction, by estimation, is 30 to 35%. The  left ventricle has moderately decreased function. The left ventricle  demonstrates global hypokinesis. The left ventricular internal cavity size  was mildly dilated. Left ventricular  diastolic parameters are indeterminate.   2. Right ventricular systolic function is normal. The right ventricular  size is normal. Tricuspid regurgitation signal is inadequate for assessing  PA pressure.   3. MEDTRONIC SIMUFORM RING is at the MV anular position. . The  mitral valve has been repaired/replaced. Trivial mitral valve  regurgitation.   4. The aortic valve has an indeterminant number of cusps. There is mild  calcification of the aortic valve. There is mild thickening of the aortic  valve. Aortic valve regurgitation is not visualized. No  aortic stenosis is  present.   5. Aortic dilatation noted. There is mild dilatation of the aortic root,  measuring 43 mm.   Comparison(s): Echocardiogram done 05/13/21 showed an EF of 30-35%.   FINDINGS   Left Ventricle: Left ventricular ejection fraction, by estimation, is 30  to 35%. The left ventricle has moderately decreased function. The left  ventricle demonstrates global hypokinesis. Global longitudinal strain  performed but not reported based on  interpreter judgement due to suboptimal tracking. The left ventricular  internal cavity size was mildly dilated. There is no left ventricular  hypertrophy. Left ventricular diastolic parameters are indeterminate.   Right Ventricle: The right ventricular size is normal. Right vetricular  wall thickness was not well visualized. Right ventricular systolic  function is normal. Tricuspid regurgitation signal is inadequate for  assessing PA pressure.   Left Atrium: Left atrial size was normal in size.   Right Atrium: Right atrial size was normal in size.   Pericardium: There is no evidence of pericardial effusion.   Mitral Valve: MEDTRONIC SIMUFORM RING is at the MV anular position.  The mitral valve has been repaired/replaced. There is mild thickening of  the mitral valve leaflet(s). There is mild calcification of the mitral  valve leaflet(s). Trivial mitral  valve regurgitation. There is a 32 mm prosthetic annuloplasty ring present  in the mitral position. The mean mitral valve gradient is 2.0 mmHg.   Tricuspid Valve: The tricuspid valve is normal in structure. Tricuspid  valve regurgitation is trivial. No evidence of tricuspid stenosis.   Aortic Valve: The aortic valve has an indeterminant number of cusps. There  is mild calcification of the aortic valve. There is mild thickening of the  aortic valve. There is mild aortic valve annular calcification. Aortic  valve regurgitation is not  visualized. No aortic stenosis is  present. Aortic valve mean gradient  measures  3.0 mmHg. Aortic valve peak gradient measures 5.3 mmHg. Aortic  valve area, by VTI measures 2.78 cm.   Pulmonic Valve: The pulmonic valve was not well visualized. Pulmonic valve  regurgitation is not visualized. No evidence of pulmonic stenosis.   Aorta: Aortic dilatation noted. There is mild dilatation of the aortic  root, measuring 43 mm.   IAS/Shunts: No atrial level shunt detected by color flow Doppler.    Assessment & Plan   1.  ***   Jonathan [name]. Jonathan Follett NP-C     01/07/2023, 8:52 AM [facility] [address] Office [phone] Fax [phone]    I spent 14*** minutes examining this patient, reviewing medications, and using patient centered shared decision making involving her cardiac care.  Prior to her visit I spent greater than 20 minutes reviewing her past medical history,  medications, and prior cardiac tests.

## 2023-01-08 ENCOUNTER — Ambulatory Visit: Payer: Medicare HMO | Attending: General Practice | Admitting: General Practice

## 2023-05-13 IMAGING — CR DG CHEST 2V
2 series · 2 of 2 positions shown · non-contrast
Comparison: 12/16/2020 from [REDACTED].

CLINICAL DATA: Preop for CABG.

EXAM:
CHEST - 2 VIEW

[w chest pa]
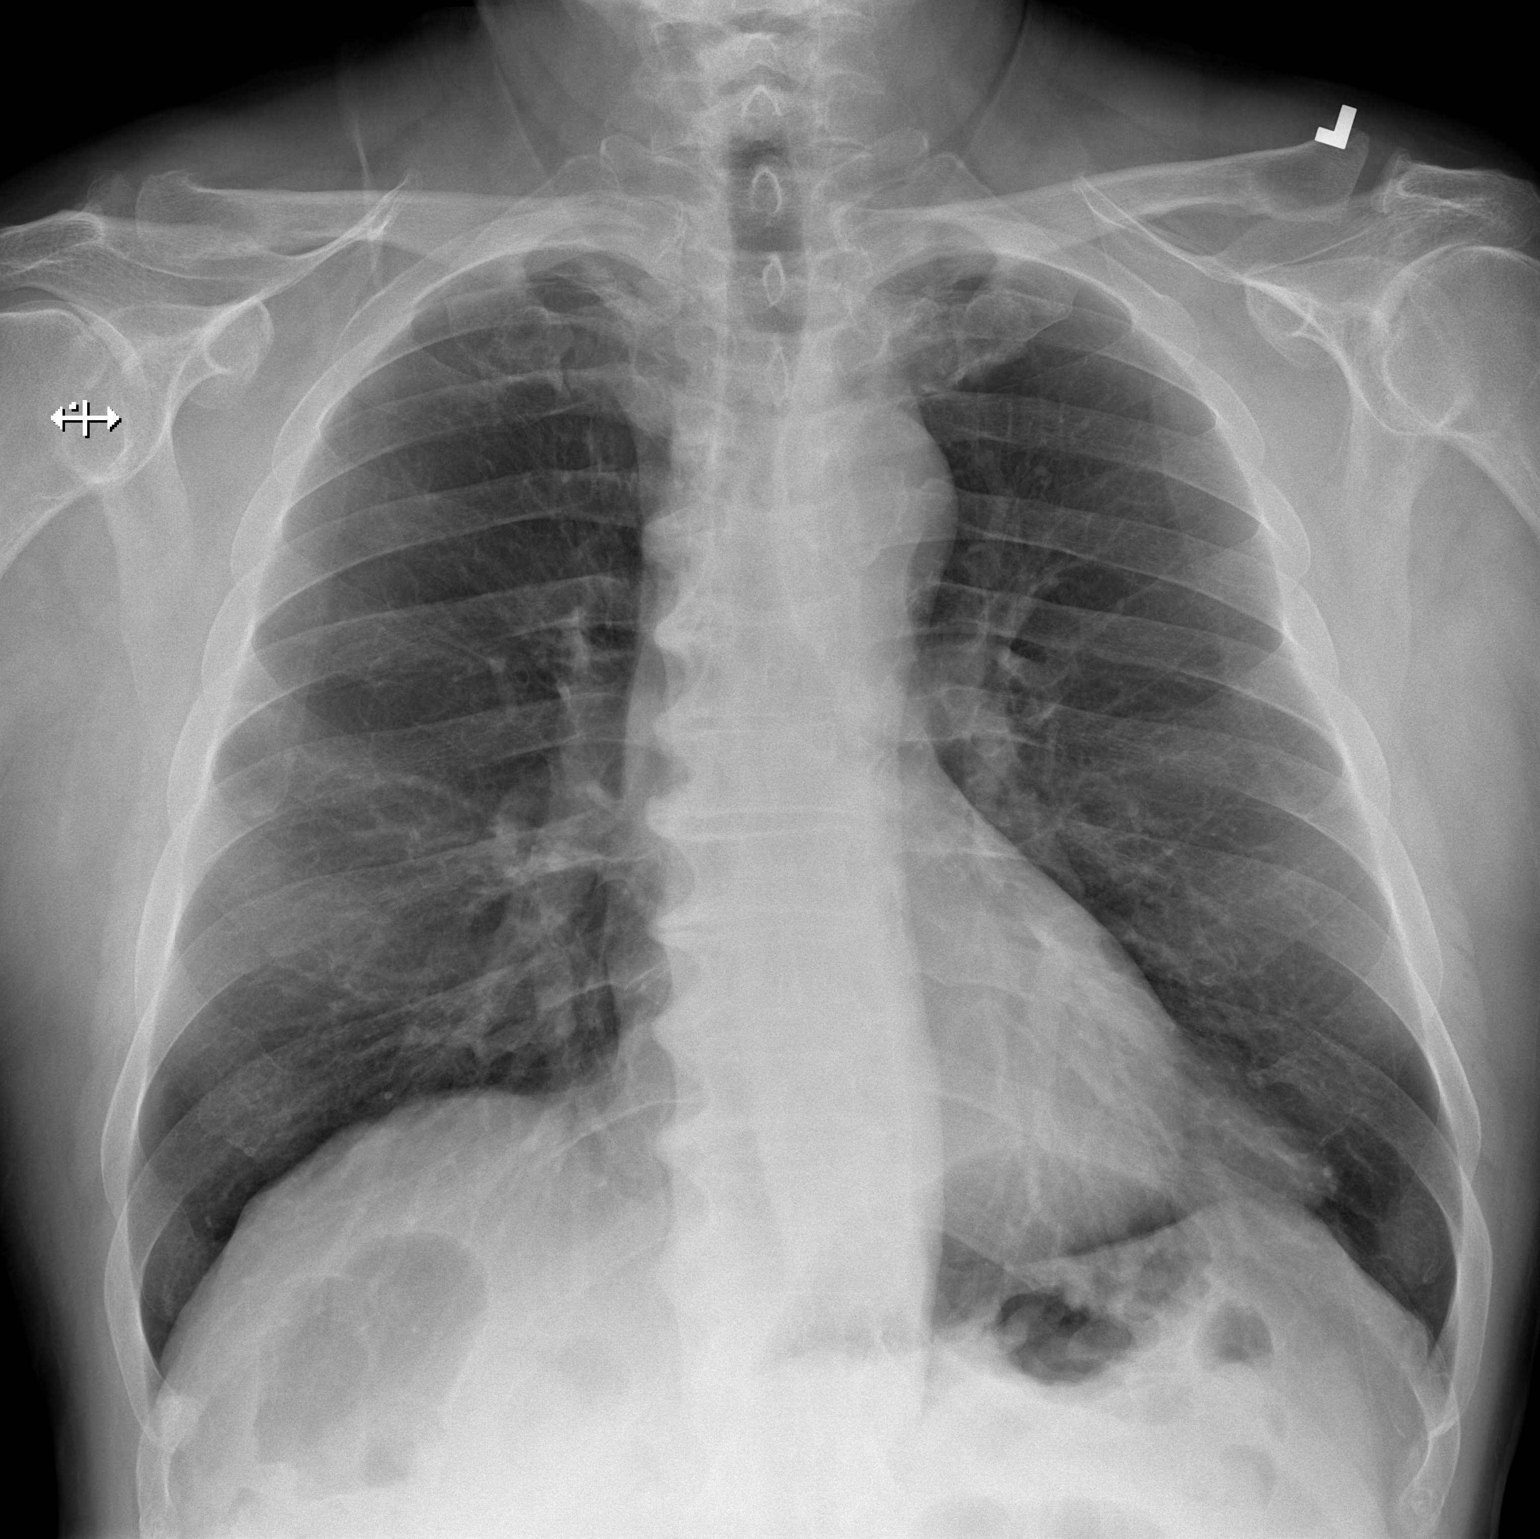

[w chest lat]
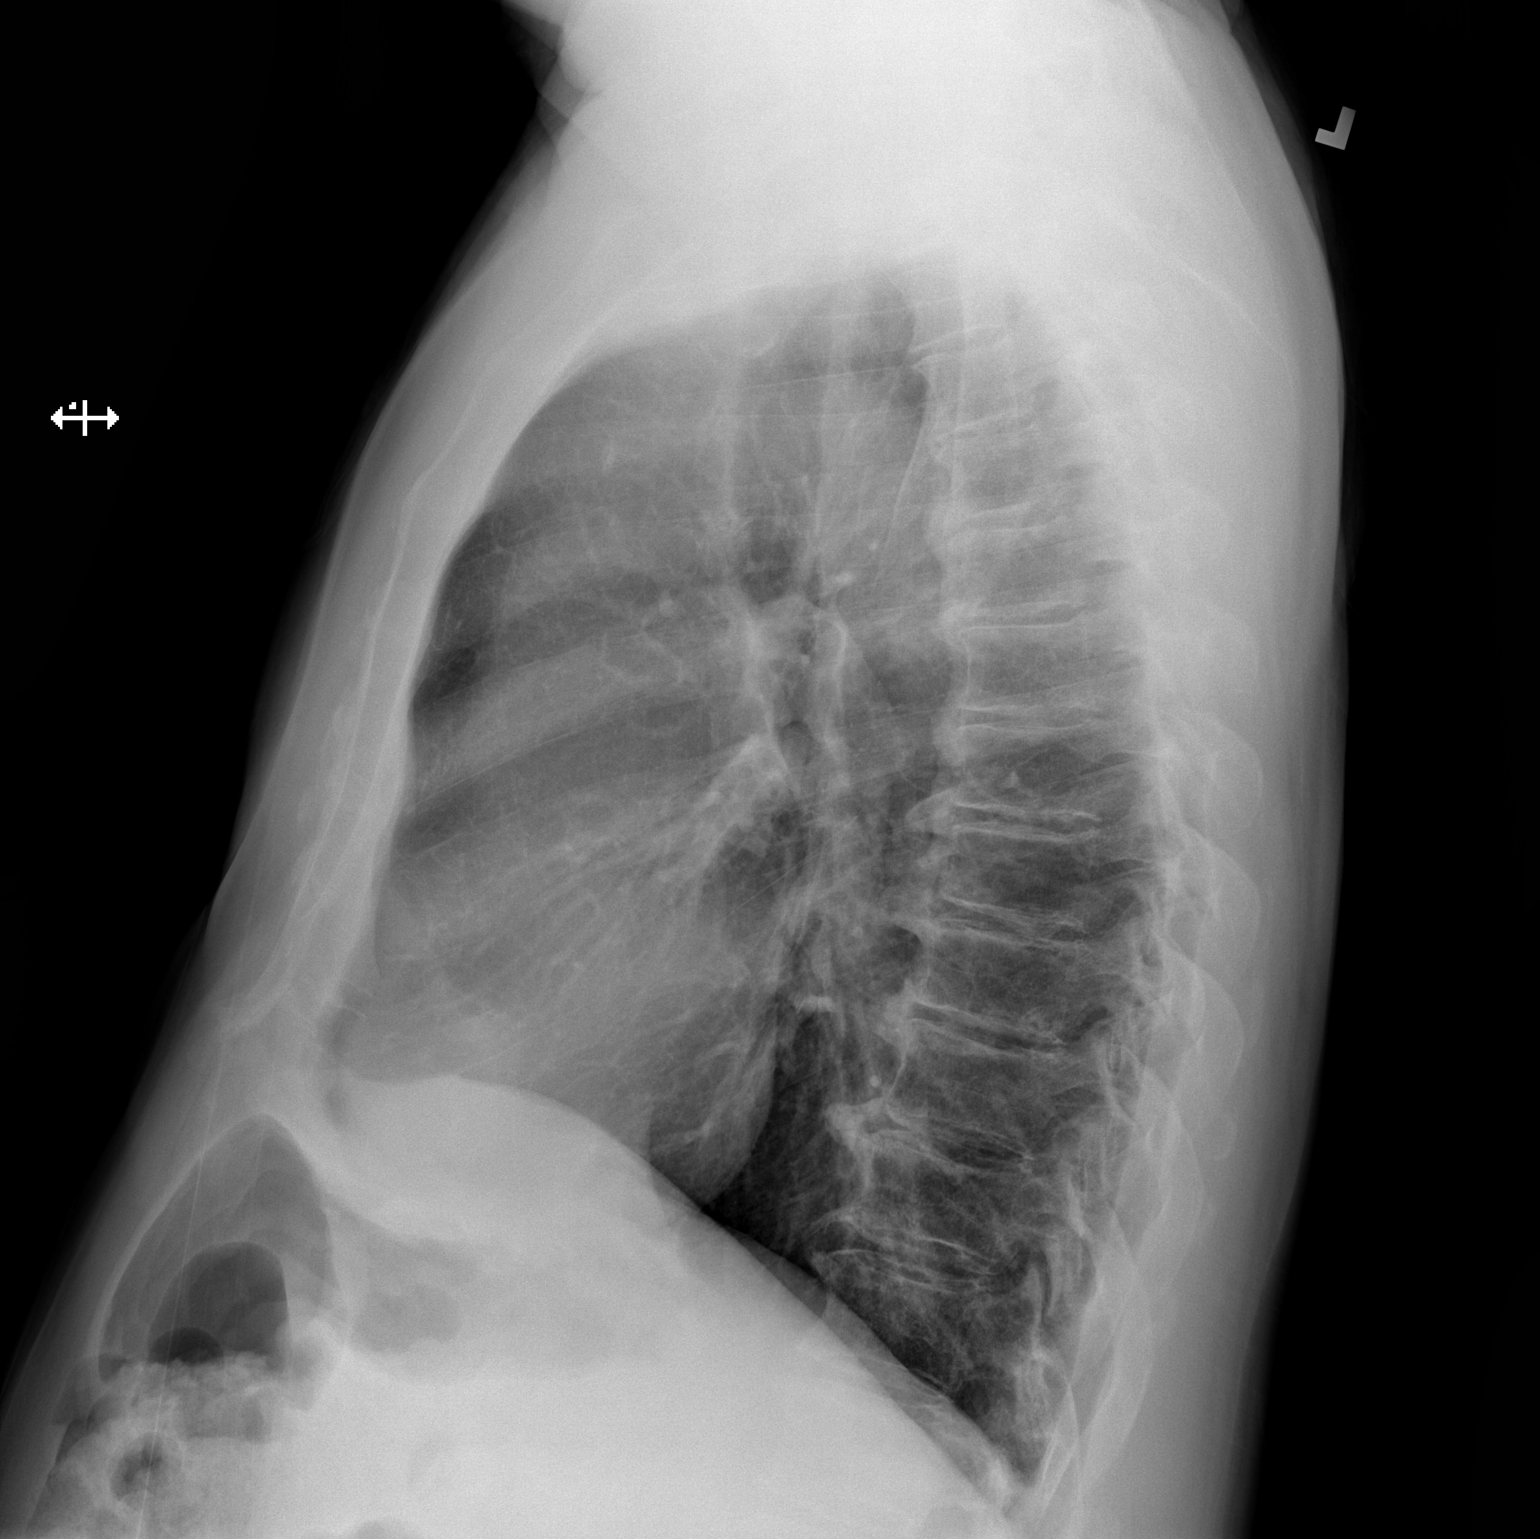

[2 of 2 positions shown; findings below may reference images not displayed]

FINDINGS: Right hemidiaphragm elevation. Midline trachea. Normal heart size.
Tortuous thoracic aorta. No pleural effusion or pneumothorax. Clear
lungs.
IMPRESSION: No acute cardiopulmonary disease.

## 2023-05-16 IMAGING — DX DG CHEST 1V PORT
1 series · 1 of 1 positions shown · non-contrast
Comparison: 01/10/2021 at 8500 hours

CLINICAL DATA: Status post CABG

EXAM:
PORTABLE CHEST 1 VIEW

[chest]
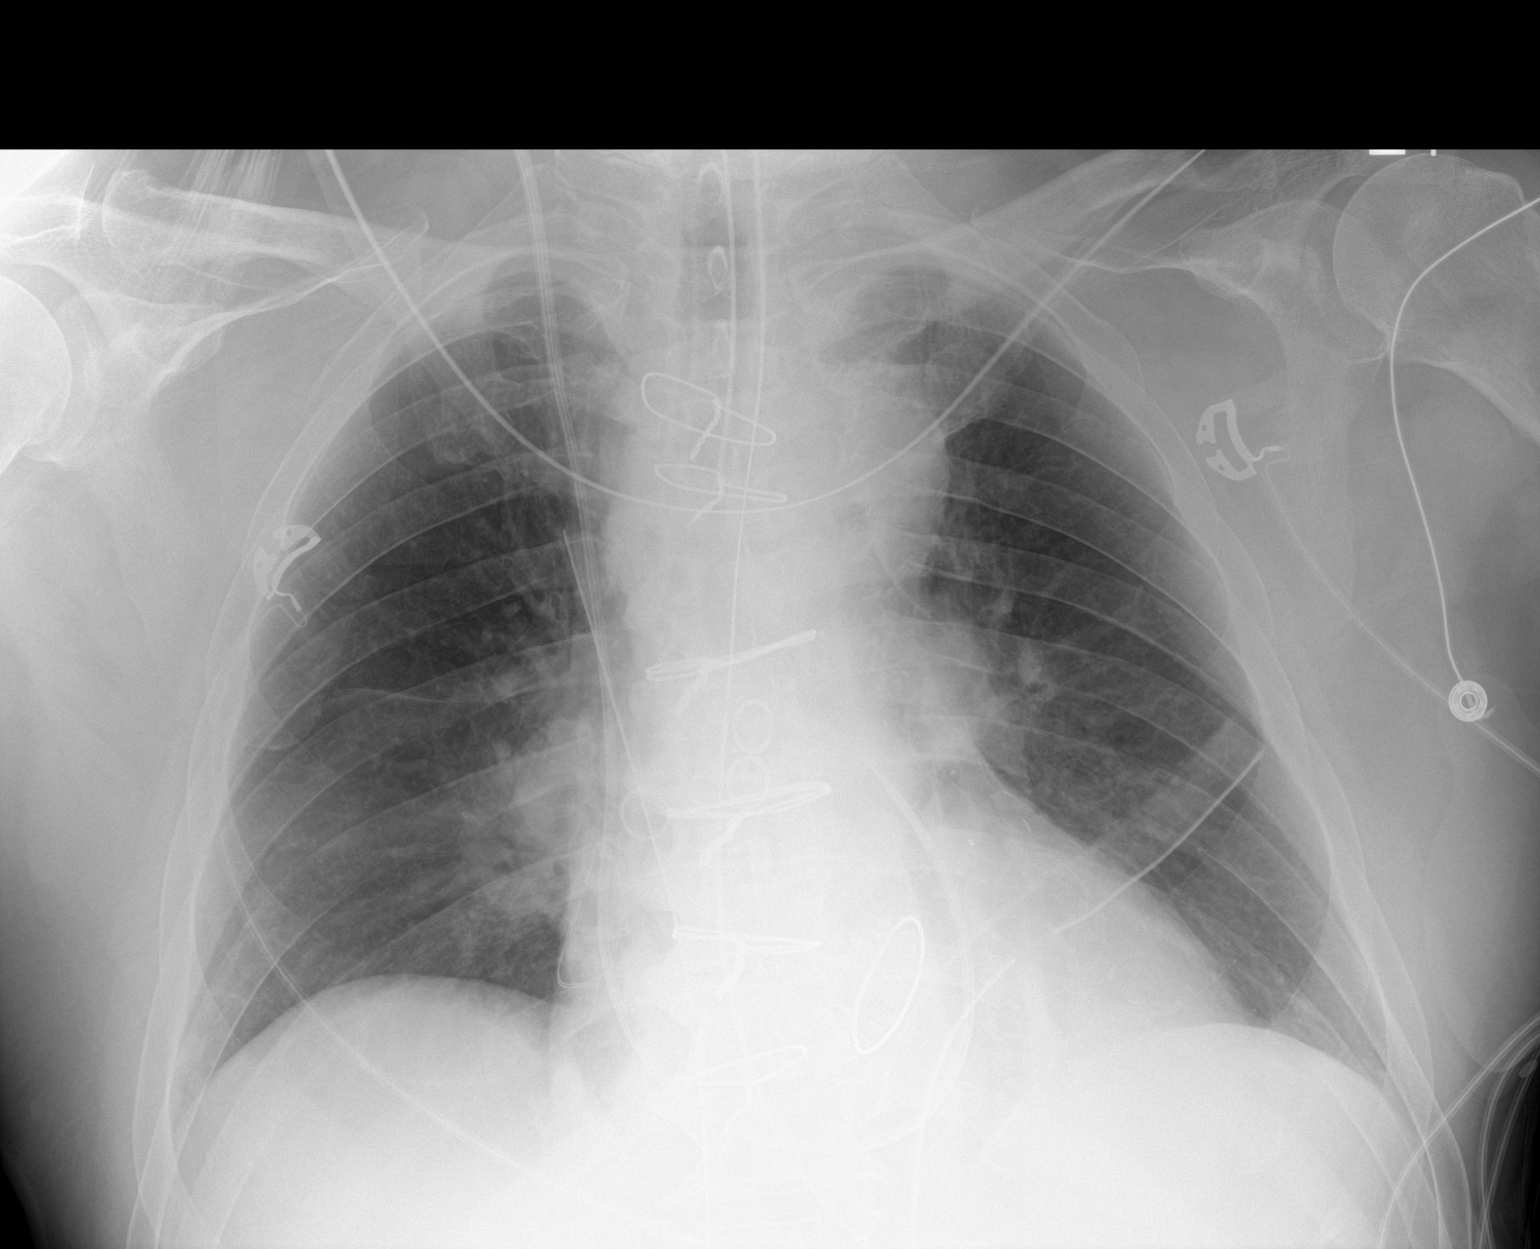

[1 of 1 positions shown; findings below may reference images not displayed]

FINDINGS: Postoperative changes status post CABG and cardiac valve
replacement. Median sternotomy wires. Previously seen curvilinear
radiodensity along the right lateral aspect of the third cerclage
wire is no longer evident. Endotracheal tube terminates 4.0 cm above
the carina. Enteric tube courses below the diaphragm with distal tip
beyond the inferior margin of the film. Right IJ Swan-Ganz catheter
unchanged in positioning. Bilateral chest tubes and mediastinal
drain remain in place.

Improved aeration of the lung fields. No airspace consolidation. No
pleural effusion or pneumothorax.
IMPRESSION: 1. Postoperative changes status post CABG and cardiac valve
replacement. No pneumothorax.
2. Support lines and tubes in appropriate position.
3. Previously described curvilinear radiodensity adjacent to the
third cerclage wire is no longer evident. Correlate with the
operative record.

## 2023-05-17 IMAGING — DX DG CHEST 1V PORT
1 series · 1 of 1 positions shown · non-contrast
Comparison: Portable exam 9999 hours compared to 01/10/2021

CLINICAL DATA: Post open heart surgery, chest soreness

EXAM:
PORTABLE CHEST 1 VIEW

[chest ap]
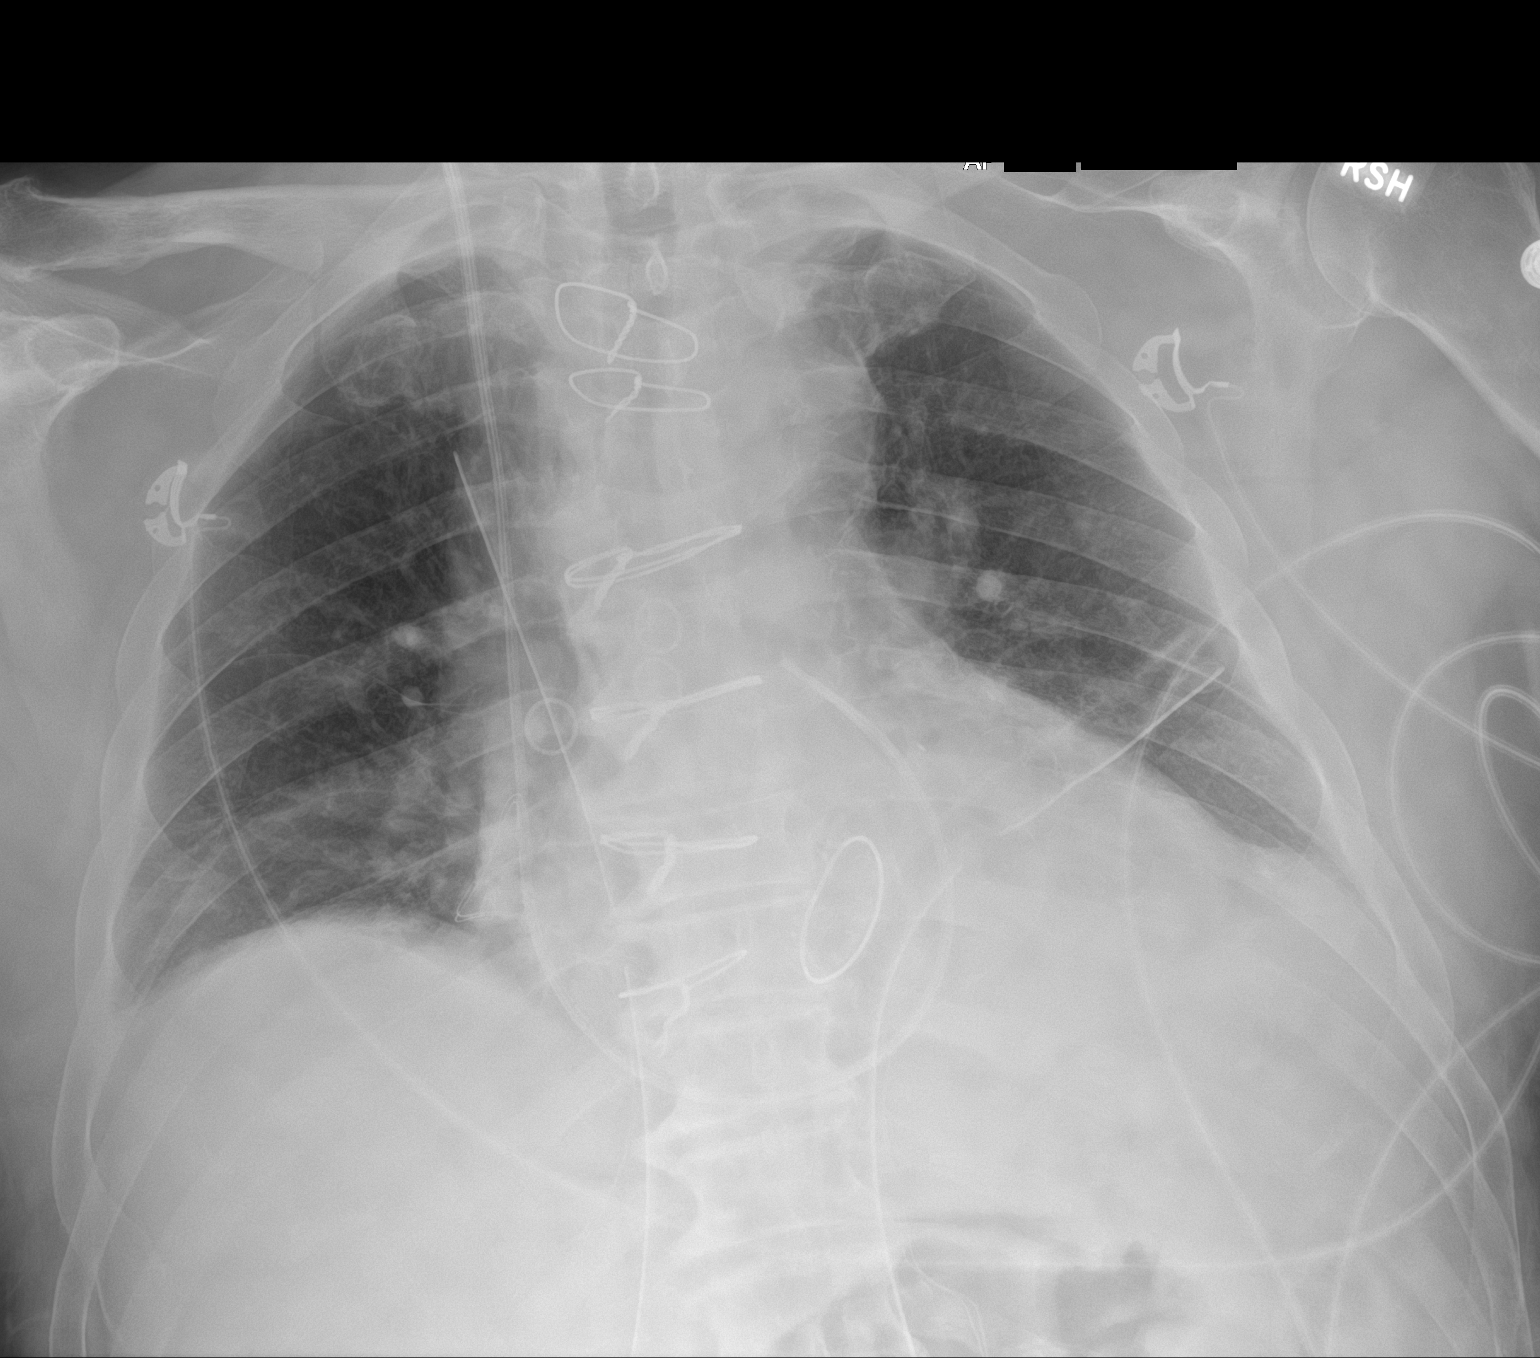

[1 of 1 positions shown; findings below may reference images not displayed]

FINDINGS: Interval removal of endotracheal and nasogastric tubes.

Mediastinal drain and LEFT thoracostomy tube remain.

Epicardial pacing wires.

RIGHT jugular Swan-Ganz catheter with tip projecting over main
pulmonary artery bifurcation.

Upper normal size of cardiac silhouette post CABG and MVR.

Prominent mediastinum related to surgery and technique.

Bibasilar atelectasis greater on LEFT.

No definite infiltrate, pleural effusion, or pneumothorax.
IMPRESSION: Bibasilar atelectasis greater on LEFT.

## 2023-07-03 DIAGNOSIS — S61213A Laceration without foreign body of left middle finger without damage to nail, initial encounter: Secondary | ICD-10-CM | POA: Diagnosis not present

## 2023-07-03 DIAGNOSIS — S61211A Laceration without foreign body of left index finger without damage to nail, initial encounter: Secondary | ICD-10-CM | POA: Diagnosis not present

## 2023-07-05 DIAGNOSIS — Z5189 Encounter for other specified aftercare: Secondary | ICD-10-CM | POA: Diagnosis not present

## 2023-11-14 DIAGNOSIS — E7800 Pure hypercholesterolemia, unspecified: Secondary | ICD-10-CM | POA: Diagnosis not present

## 2023-11-14 DIAGNOSIS — J439 Emphysema, unspecified: Secondary | ICD-10-CM | POA: Diagnosis not present

## 2023-11-14 DIAGNOSIS — E1159 Type 2 diabetes mellitus with other circulatory complications: Secondary | ICD-10-CM | POA: Diagnosis not present

## 2023-11-14 DIAGNOSIS — G629 Polyneuropathy, unspecified: Secondary | ICD-10-CM | POA: Diagnosis not present

## 2023-11-14 DIAGNOSIS — I2510 Atherosclerotic heart disease of native coronary artery without angina pectoris: Secondary | ICD-10-CM | POA: Diagnosis not present

## 2023-11-14 DIAGNOSIS — M25579 Pain in unspecified ankle and joints of unspecified foot: Secondary | ICD-10-CM | POA: Diagnosis not present

## 2023-11-14 DIAGNOSIS — I509 Heart failure, unspecified: Secondary | ICD-10-CM | POA: Diagnosis not present

## 2023-11-14 DIAGNOSIS — Z0000 Encounter for general adult medical examination without abnormal findings: Secondary | ICD-10-CM | POA: Diagnosis not present

## 2023-12-04 DIAGNOSIS — S61311A Laceration without foreign body of left index finger with damage to nail, initial encounter: Secondary | ICD-10-CM | POA: Diagnosis not present

## 2023-12-04 DIAGNOSIS — Z23 Encounter for immunization: Secondary | ICD-10-CM | POA: Diagnosis not present

## 2024-05-23 ENCOUNTER — Telehealth: Payer: Self-pay | Admitting: Cardiology

## 2024-05-23 NOTE — Telephone Encounter (Signed)
 Spoke to patient Dr.Jordan advised continue medications.Appointment scheduled with Dr.Jordan 12/1 at 11:20 am.

## 2024-05-23 NOTE — Telephone Encounter (Signed)
 Spoke with pt regarding his medications. Pt stated he had labs drawn a month ago with his PCP and was supposed to hear from us  whether he was to continue taking Jardiance . Pt has stopped taking Crestor  and would like to know whether he is supposed to be taking it as well. Pt was told his questions would be sent to Dr. Swaziland for review. Pt verbalized understanding. All questions if any were answered.

## 2024-05-23 NOTE — Telephone Encounter (Signed)
 Pt c/o medication issue:  1. Name of Medication: rosuvastatin  (CRESTOR ) 20 MG tablet (Expired)  empagliflozin  (JARDIANCE ) 25 MG TABS tablet  2. How are you currently taking this medication (dosage and times per day)? As Written  3. Are you having a reaction (difficulty breathing--STAT)? No  4. What is your medication issue? Patient is confused about his medications and what the plan was for him after he had his procedure. Please advise.

## 2024-06-24 NOTE — Progress Notes (Signed)
 Cardiology Office Note   Date:  06/30/2024   ID:  Jonathan [name]., DOB 1951-05-24, MRN [id number]  PCP:  Nanci Senior, MD  Cardiologist:   Yulian Gosney, MD   Chief Complaint  Patient presents with   Coronary Artery Disease   Congestive Heart Failure      History of Present Illness: Jonathan [name]. is a 73 y.o. male who is seen for follow up of [facility] and CAD. He has a history of DM and HLD. He was admitted to [facility] 12/20/20 with acute  CHF. Pro BNP was 909. HS troponin 50>46>46. CXR and CT chest showed mild interstitial edema. No PE. Echo showed moderate to severe LV enlargement. EF 25-30%. Moderate MR and LAE. Normal RV function. Gr 1 diastolic dysfunction. He was diuresed 7 lbs.   He underwent cardiac catheterization 12/30/2020 showed severe three-vessel obstructive CAD. He was referred to cardiothoracic surgery. He underwent CABG x 4 on 01/10/2021 by Dr. Kerrin. This included LIMA to LAD, SVG to diag, SVG to OM and SVG to distal RCA. He also underwent mitral valve repair with ring annuloplasty. Echocardiogram 05/13/2021 showed an EF of 30-35%, intermediate diastolic parameters, repaired mitral valve with mild mitral valve regurgitation and mean gradient of 4 mmHg, normal prosthetic parameters.   He was only seen once in our office in follow up in April 2024. Echo at that time showed persistently low EF 30-35%. Was prescribed losartan but has had no follow up since.   He did see Dr Nanci in September states he is taking Crestor  20 mg daily and Jardiance  10 mg daily. 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 Has rare intermittent chest pain that he attributes to indigestion. Does have chronic SOB. Has a history of 3 ppd smoking but is no longer smoking. No edema.    Past Medical History:  Diagnosis Date   Cancer (HCC)    skin on nose   CHF (congestive heart failure) (HCC)    Coronary artery disease    Diabetes mellitus without complication (HCC)     History of kidney stones    Hyperlipidemia    Hypertension    Kidney stones    Sciatica     Past Surgical History:  Procedure Laterality Date   BACK SURGERY     CHEST EXPLORATION  01/10/2021   Procedure: CHEST EXPLORATION FOR RETAINED FOREIGN OBJECT;  Surgeon: Kerrin Elspeth BROCKS, MD;  Location: [facility] OR;  Service: Open Heart Surgery;;   COLONOSCOPY     CORONARY ARTERY BYPASS GRAFT N/A 01/10/2021   Procedure: CORONARY ARTERY BYPASS GRAFTING (CABG) TIMES FOUR, ON PUMP, USING LEFT INTERNAL MAMMARY ARTERY AND ENDOSCOPICALLY HARVESTED LEG VEIN;  Surgeon: Kerrin Elspeth BROCKS, MD;  Location: [facility] OR;  Service: Open Heart Surgery;  Laterality: N/A;   ENDOVEIN HARVEST OF GREATER SAPHENOUS VEIN Right 01/10/2021   Procedure: ENDOVEIN HARVEST OF GREATER SAPHENOUS VEIN;  Surgeon: Kerrin Elspeth BROCKS, MD;  Location: [facility] OR;  Service: Open Heart Surgery;  Laterality: Right;   LITHOTRIPSY     MITRAL VALVE REPAIR  01/10/2021   Procedure: MITRAL VALVE REPAIR (MVR) USING MEDTRONIC SIMUFORM RING;  Surgeon: Kerrin Elspeth BROCKS, MD;  Location: [facility] OR;  Service: Open Heart Surgery;;   NOSE SURGERY Left 02/28/2017   skin reconstruction of nose from skin cancer   RIGHT/LEFT HEART CATH AND CORONARY ANGIOGRAPHY N/A 12/30/2020   Procedure: RIGHT/LEFT HEART CATH AND CORONARY ANGIOGRAPHY;  Surgeon: Arla Boutwell M, MD;  Location: [facility] INVASIVE CV LAB;  Service: Cardiovascular;  Laterality: N/A;   TEE WITHOUT CARDIOVERSION N/A 01/10/2021   Procedure: TRANSESOPHAGEAL ECHOCARDIOGRAM (TEE);  Surgeon: Kerrin Elspeth BROCKS, MD;  Location: [facility] OR;  Service: Open Heart Surgery;  Laterality: N/A;     Current Outpatient Medications  Medication Sig Dispense Refill   Cholecalciferol (VITAMIN D-3 PO) Take 1 capsule by mouth daily.     empagliflozin  (JARDIANCE ) 10 MG TABS tablet Take 1 tablet (10 mg total) by mouth daily before breakfast.     ezetimibe (ZETIA) 10 MG tablet Take 1 tablet (10 mg total) by mouth daily. 90  tablet 3   rosuvastatin  (CRESTOR ) 20 MG tablet Take 1 tablet (20 mg total) by mouth daily. 90 tablet 3   sacubitril-valsartan (ENTRESTO ) 24-26 MG Take 1 tablet by mouth 2 (two) times daily. 60 tablet 6   Saline (SIMPLY SALINE) 0.9 % AERS Place 1 spray into the nose every morning.     aspirin  EC 81 MG tablet Take 1 tablet (81 mg total) by mouth daily. Swallow whole. 30 tablet 11   No current facility-administered medications for this visit.    Allergies:   Grass pollen([id number] vern)    Social History:  The patient  reports that he quit smoking about 6 years ago. His smoking use included cigarettes. He has never used smokeless tobacco. He reports current alcohol use of about 24.0 standard drinks of alcohol per week. He reports that he does not use drugs.   Family History:  The patient's family history includes COPD in his mother; Heart attack in his father; Heart failure in his mother.    ROS:  Please see the history of present illness.   Otherwise, review of systems are positive for none.   All other systems are reviewed and negative.    PHYSICAL EXAM: VS:  BP 113/64   Pulse 76   Ht 5' 9 (1.753 m)   Wt 196 lb 9.6 oz (89.2 kg)   SpO2 95%   BMI 29.03 kg/m  , BMI Body mass index is 29.03 kg/m. GEN: Well nourished, well developed, in no acute distress  HEENT: normal  Neck: no JVD, carotid bruits, or masses Cardiac: RRR; no murmurs, rubs, or gallops,no edema  Respiratory:  clear to auscultation bilaterally, normal work of breathing GI: soft, nontender, nondistended, + BS MS: no deformity or atrophy  Skin: warm and dry, no rash Neuro:  Strength and sensation are intact Psych: euthymic mood, full affect   EKG Interpretation Date/Time:  Monday June 30 2024 11:18:38 EST Ventricular Rate:  76 PR Interval:  142 QRS Duration:  90 QT Interval:  432 QTC Calculation: 486 R Axis:   89  Text Interpretation: Normal sinus rhythm Nonspecific T wave abnormality Prolonged QT When  compared with ECG of 11-Jan-2021 06:36, Questionable change in QRS axis Non-specific change in ST segment in Inferior leads Non-specific change in ST segment in Lateral leads Inverted T waves have replaced nonspecific T wave abnormality in Inferior leads QT has shortened Confirmed by Aleighna Wojtas [phone]) on 06/30/2024 11:20:48 AM    Cardiac catheterization 12/30/2020 Ost LM lesion is 35% stenosed. Prox LAD lesion is 90% stenosed. Mid LAD lesion is 70% stenosed. 1st Diag lesion is 50% stenosed. Prox Cx to Mid Cx lesion is 99% stenosed. Prox RCA lesion is 70% stenosed. LV end diastolic pressure is normal.   1. Severe 3 vessel obstructive CAD 2. Normal LV filling pressures 3. Normal right heart pressures.  4. Normal cardiac output  Plan: recommend referral to CT surgery for CABG. He has good targets. Given LV dysfunction, DM, and multivessel CAD his best option is CABG.      Echocardiogram 05/13/2021   IMPRESSIONS     1. Left ventricular ejection fraction, by estimation, is 30 to 35%. The  left ventricle has moderately decreased function. The left ventricle  demonstrates regional wall motion abnormalities (apical hypokinesis and  abnormal septal motion). Left  ventricular diastolic parameters are indeterminate.   2. Right ventricular systolic function is low normal. The right  ventricular size is normal. Tricuspid regurgitation signal is inadequate  for assessing PA pressure.   3. The mitral valve has been replaced by a 32 mm Medtronic SimuForm ring-  procedure date 01/12/21. Mild mitral valve regurgitation. The mean mitral  valve gradient is 4.0 mmHg with average heart rate of 84 bpm. Normal  mitral prosthetic parameters   4. Prosthetic parameters: PHT 115 ms, TVI 2.1, EOAi 1.4 cm2, mean  gradient 4 mmHg.   5. The aortic valve is tricuspid. Aortic valve regurgitation is not  visualized. No aortic stenosis is present.   6. Aortic dilatation noted. There is mild dilatation of the  aortic root,  measuring 41 mm. There is mild dilatation of the ascending aorta,  measuring 41 mm.   7. The inferior vena cava is normal in size with greater than 50%  respiratory variability, suggesting right atrial pressure of 3 mmHg.   Comparison(s): A prior study was performed on 01/10/21. TEE 01/10/21 EF  20-30%. MV mean PG.   FINDINGS   Left Ventricle: Left ventricular ejection fraction, by estimation, is 30  to 35%. The left ventricle has moderately decreased function. The left  ventricle demonstrates regional wall motion abnormalities. The left  ventricular internal cavity size was  normal in size. There is no left ventricular hypertrophy. Left ventricular  diastolic parameters are indeterminate.    Echo 10/30/22: IMPRESSIONS     1. Left ventricular ejection fraction, by estimation, is 30 to 35%. The  left ventricle has moderately decreased function. The left ventricle  demonstrates global hypokinesis. The left ventricular internal cavity size  was mildly dilated. Left ventricular  diastolic parameters are indeterminate.   2. Right ventricular systolic function is normal. The right ventricular  size is normal. Tricuspid regurgitation signal is inadequate for assessing  PA pressure.   3. MEDTRONIC SIMUFORM RING is at the MV anular position. . The  mitral valve has been repaired/replaced. Trivial mitral valve  regurgitation.   4. The aortic valve has an indeterminant number of cusps. There is mild  calcification of the aortic valve. There is mild thickening of the aortic  valve. Aortic valve regurgitation is not visualized. No aortic stenosis is  present.   5. Aortic dilatation noted. There is mild dilatation of the aortic root,  measuring 43 mm.   Comparison(s): Echocardiogram done 05/13/21 showed an EF of 30-35%.    Recent Labs: No results found for requested labs within last 365 days.    Lipid Panel    Component Value Date/Time   CHOL 211 (H) 12/22/2020  1546   TRIG 226 (H) 12/22/2020 1546   HDL 37 (L) 12/22/2020 1546   CHOLHDL 5.7 (H) 12/22/2020 1546   LDLCALC 133 (H) 12/22/2020 1546     Dated 07/31/19 cholesterol 190, triglycerides 99, HDL 35, LDL 137.Hgb 14.5. Dated 12/16/20: A1c 9.1%. CMET normal Dated 12/20/20: D dimer 0.61. Coags normal. Normal swab for COVID and flu UA glucose > 1000.  15 mg/dl ketones.  CBC normal Glucose 307. Creatinine 0.97>>1.14. otherwise CMET normal.  Dated 04/21/24: A1c 8.2%. LFTs normal. Cholesterol 187, triglycerides 147, HDL 47, LDL 114. Glucose 253. Otherwise CMET normal.   Wt Readings from Last 3 Encounters:  06/30/24 196 lb 9.6 oz (89.2 kg)  09/28/22 188 lb 6.4 oz (85.5 kg)  05/24/21 205 lb (93 kg)      ASSESSMENT AND PLAN:  1.  Chronic systolic CHF. EF 30-35%. Secondary to ischemic CM. Recommend starting Entresto  24/26 mg bid. Continue Jardiance . Instructed him on importance of taking medication to reduce risk of dying and hospitalization. Low sodium diet. Follow up in 2 months to see if we can titrate medication further.   2. CAD s/p CABG and MV repair in June 2022. No significant angina.   2. DM poorly controlled. Last A1c 8.2%. discussed importance of restricting sweet intake. Refuses to take metformin . Continue Jardiance .  3. HLD. LDL 114 on Crestor  20 mg - not at goal of at least < 70. Will add Zetia 10 mg daily.   4. HTN. BP is normal on no medication.   5. Former smoker.   6. Etoh use. Patient states he was drinking a 12 pack of beer a day. Has not had any in past week. Encourage cessation of Etoh given negative effects on DM and CHF.    Current medicines are reviewed at length with the patient today.  The patient does not have concerns regarding medicines.  The following changes have been made:  See above  Labs/ tests ordered today include:   Orders Placed This Encounter  Procedures   EKG 12-Lead  RLHC.    Disposition:   FU 2 months  Signed, Atreyu Mak, MD  06/30/2024  11:40 AM    [facility] [address] [place], [place], [address] Phone [phone], Fax [phone]

## 2024-06-30 ENCOUNTER — Ambulatory Visit: Attending: Cardiology | Admitting: Cardiology

## 2024-06-30 ENCOUNTER — Encounter: Payer: Self-pay | Admitting: Cardiology

## 2024-06-30 VITALS — BP 113/64 | HR 76 | Ht 69.0 in | Wt 196.6 lb

## 2024-06-30 DIAGNOSIS — I5021 Acute systolic (congestive) heart failure: Secondary | ICD-10-CM | POA: Diagnosis not present

## 2024-06-30 DIAGNOSIS — R0609 Other forms of dyspnea: Secondary | ICD-10-CM | POA: Diagnosis not present

## 2024-06-30 DIAGNOSIS — Z9889 Other specified postprocedural states: Secondary | ICD-10-CM | POA: Diagnosis not present

## 2024-06-30 DIAGNOSIS — Z951 Presence of aortocoronary bypass graft: Secondary | ICD-10-CM

## 2024-06-30 DIAGNOSIS — I2510 Atherosclerotic heart disease of native coronary artery without angina pectoris: Secondary | ICD-10-CM

## 2024-06-30 MED ORDER — EZETIMIBE 10 MG PO TABS: 10.0000 mg | ORAL_TABLET | Freq: Every day | ORAL | 3 refills | Status: AC

## 2024-06-30 MED ORDER — SACUBITRIL-VALSARTAN 24-26 MG PO TABS: 1.0000 | ORAL_TABLET | Freq: Two times a day (BID) | ORAL | 6 refills | Status: DC

## 2024-06-30 MED ORDER — EMPAGLIFLOZIN 10 MG PO TABS: 10.0000 mg | ORAL_TABLET | Freq: Every day | ORAL | Status: DC

## 2024-06-30 MED ORDER — ASPIRIN 81 MG PO TBEC: 81.0000 mg | DELAYED_RELEASE_TABLET | Freq: Every day | ORAL | 11 refills | Status: DC

## 2024-06-30 NOTE — Patient Instructions (Addendum)
 Medication Instructions:  Start Zetia 10 mg daily Start Entresto  24/26 mg twice a day Continue all other medications *If you need a refill on your cardiac medications before your next appointment, please call your pharmacy*  Lab Work: None ordered  Testing/Procedures: None ordered  Follow-Up: At [facility], you and your health needs are our priority.  As part of our continuing mission to provide you with exceptional heart care, our providers are all part of one team.  This team includes your primary Cardiologist (physician) and Advanced Practice Providers or APPs (Physician Assistants and Nurse Practitioners) who all work together to provide you with the care you need, when you need it.  Your next appointment:  2 months    Wednesday 2/4 at 11:20 am    Provider:  Dr.Jordan   We recommend signing up for the patient portal called MyChart.  Sign up information is provided on this After Visit Summary.  MyChart is used to connect with patients for Virtual Visits (Telemedicine).  Patients are able to view lab/test results, encounter notes, upcoming appointments, etc.  Non-urgent messages can be sent to your provider as well.   To learn more about what you can do with MyChart, go to [email or web address].

## 2024-08-29 NOTE — Progress Notes (Unsigned)
 "    Cardiology Office Note   Date:  09/03/2024   ID:  Jonathan Putzier., DOB 09-20-1950, MRN [id number]  PCP:  Nanci Senior, MD  Cardiologist:   Pharoah Goggins, MD   Chief Complaint  Patient presents with   Coronary Artery Disease   Chest Pain      History of Present Illness: Jonathan [name]. is a 74 y.o. male who is seen for follow up of [facility] and CAD. He has a history of DM and HLD. He was admitted to [facility] 12/20/20 with acute  CHF. Pro BNP was 909. HS troponin 50>46>46. CXR and CT chest showed mild interstitial edema. No PE. Echo showed moderate to severe LV enlargement. EF 25-30%. Moderate MR and LAE. Normal RV function. Gr 1 diastolic dysfunction. He was diuresed 7 lbs.   He underwent cardiac catheterization 12/30/2020 showed severe three-vessel obstructive CAD. He was referred to cardiothoracic surgery. He underwent CABG x 4 on 01/10/2021 by Dr. Kerrin. This included LIMA to LAD, SVG to diag, SVG to OM and SVG to distal RCA. He also underwent mitral valve repair with ring annuloplasty. Echocardiogram 05/13/2021 showed an EF of 30-35%, intermediate diastolic parameters, repaired mitral valve with mild mitral valve regurgitation and mean gradient of 4 mmHg, normal prosthetic parameters.   He was only seen once in our office in follow up in April 2024. Echo at that time showed persistently low EF 30-35%. Was prescribed losartan but was not taking this. Later was prescribed entresto .   On follow up today he notes he ran out of his medication for sugar and Entresto  so quit taking it. Was taking Crestor . Also not taking ASA. Unclear what he is taking for DM. Notes some random chest pain in right chest at night. Last 5-10 minutes. Not associated with activity. [place] is [place] his [place].    Past Medical History:  Diagnosis Date   Cancer (HCC)    skin on nose   CHF (congestive heart failure) (HCC)    Coronary artery disease    Diabetes mellitus without complication (HCC)     History of kidney stones    Hyperlipidemia    Hypertension    Kidney stones    Sciatica     Past Surgical History:  Procedure Laterality Date   BACK SURGERY     CHEST EXPLORATION  01/10/2021   Procedure: CHEST EXPLORATION FOR RETAINED FOREIGN OBJECT;  Surgeon: Kerrin Elspeth BROCKS, MD;  Location: [facility] OR;  Service: Open Heart Surgery;;   COLONOSCOPY     CORONARY ARTERY BYPASS GRAFT N/A 01/10/2021   Procedure: CORONARY ARTERY BYPASS GRAFTING (CABG) TIMES FOUR, ON PUMP, USING LEFT INTERNAL MAMMARY ARTERY AND ENDOSCOPICALLY HARVESTED LEG VEIN;  Surgeon: Kerrin Elspeth BROCKS, MD;  Location: [facility] OR;  Service: Open Heart Surgery;  Laterality: N/A;   ENDOVEIN HARVEST OF GREATER SAPHENOUS VEIN Right 01/10/2021   Procedure: ENDOVEIN HARVEST OF GREATER SAPHENOUS VEIN;  Surgeon: Kerrin Elspeth BROCKS, MD;  [facility];  Service: Open Heart Surgery;  Laterality: Right;   LITHOTRIPSY     MITRAL VALVE REPAIR  01/10/2021   Procedure: MITRAL VALVE REPAIR (MVR) USING MEDTRONIC SIMUFORM RING;  Surgeon: Kerrin Elspeth BROCKS, MD;  Location: [facility] OR;  Service: Open Heart Surgery;;   NOSE SURGERY Left 02/28/2017   skin reconstruction of nose from skin cancer   RIGHT/LEFT HEART CATH AND CORONARY ANGIOGRAPHY N/A 12/30/2020   Procedure: RIGHT/LEFT HEART CATH AND CORONARY ANGIOGRAPHY;  Surgeon: Meghan Warshawsky M, MD;  Location: [facility] INVASIVE CV LAB;  Service: Cardiovascular;  Laterality: N/A;   TEE WITHOUT CARDIOVERSION N/A 01/10/2021   Procedure: TRANSESOPHAGEAL ECHOCARDIOGRAM (TEE);  Surgeon: Kerrin Elspeth BROCKS, MD;  Location: [facility] OR;  Service: Open Heart Surgery;  Laterality: N/A;     Current Outpatient Medications  Medication Sig Dispense Refill   Cholecalciferol (VITAMIN D-3 PO) Take 1 capsule by mouth daily.     ezetimibe  (ZETIA ) 10 MG tablet Take 1 tablet (10 mg total) by mouth daily. 90 tablet 3   glipiZIDE-metformin  (METAGLIP) 5-500 MG tablet Take 1 tablet by mouth 2 (two) times daily before a  meal.     glyBURIDE-metformin  (GLUCOVANCE) 5-500 MG tablet Take 2 tablets by mouth daily with breakfast.     Saline (SIMPLY SALINE) 0.9 % AERS Place 1 spray into the nose every morning.     aspirin  EC 81 MG tablet Take 1 tablet (81 mg total) by mouth daily. Swallow whole. 30 tablet 11   empagliflozin  (JARDIANCE ) 10 MG TABS tablet Take 1 tablet (10 mg total) by mouth daily before breakfast. 90 tablet 3   rosuvastatin  (CRESTOR ) 20 MG tablet Take 1 tablet (20 mg total) by mouth daily. 90 tablet 3   sacubitril -valsartan  (ENTRESTO ) 24-26 MG Take 1 tablet by mouth 2 (two) times daily. 180 tablet 3   No current facility-administered medications for this visit.    Allergies:   Grass pollen([id number] vern)    Social History:  The patient  reports that he quit smoking about 6 years ago. His smoking use included cigarettes. He has never used smokeless tobacco. He reports current alcohol use of about 24.0 standard drinks of alcohol per week. He reports that he does not use drugs.   Family History:  The patient's family history includes COPD in his mother; Heart attack in his father; Heart failure in his mother.    ROS:  Please see the history of present illness.   Otherwise, review of systems are positive for none.   All other systems are reviewed and negative.    PHYSICAL EXAM: VS:  BP 110/69 (BP Location: Left Arm, Patient Position: Sitting, Cuff Size: Normal)   Pulse 77   Ht 5' 9 (1.753 m)   Wt 194 lb 6.4 oz (88.2 kg)   SpO2 97%   BMI 28.71 kg/m  , BMI Body mass index is 28.71 kg/m. GEN: Well nourished, well developed, in no acute distress  HEENT: normal  Neck: no JVD, carotid bruits, or masses Cardiac: RRR; no murmurs, rubs, or gallops,no edema  Respiratory:  clear to auscultation bilaterally, normal work of breathing GI: soft, nontender, nondistended, + BS MS: no deformity or atrophy  Skin: warm and dry, no rash Neuro:  Strength and sensation are intact Psych: euthymic mood, full  affect        Cardiac catheterization 12/30/2020 Ost LM lesion is 35% stenosed. Prox LAD lesion is 90% stenosed. Mid LAD lesion is 70% stenosed. 1st Diag lesion is 50% stenosed. Prox Cx to Mid Cx lesion is 99% stenosed. Prox RCA lesion is 70% stenosed. LV end diastolic pressure is normal.   1. Severe 3 vessel obstructive CAD 2. Normal LV filling pressures 3. Normal right heart pressures.  4. Normal cardiac output   Plan: recommend referral to CT surgery for CABG. He has good targets. Given LV dysfunction, DM, and multivessel CAD his best option is CABG.      Echocardiogram 05/13/2021   IMPRESSIONS     1. Left ventricular ejection fraction, by estimation,  is 30 to 35%. The  left ventricle has moderately decreased function. The left ventricle  demonstrates regional wall motion abnormalities (apical hypokinesis and  abnormal septal motion). Left  ventricular diastolic parameters are indeterminate.   2. Right ventricular systolic function is low normal. The right  ventricular size is normal. Tricuspid regurgitation signal is inadequate  for assessing PA pressure.   3. The mitral valve has been replaced by a 32 mm Medtronic SimuForm ring-  procedure date 01/12/21. Mild mitral valve regurgitation. The mean mitral  valve gradient is 4.0 mmHg with average heart rate of 84 bpm. Normal  mitral prosthetic parameters   4. Prosthetic parameters: PHT 115 ms, TVI 2.1, EOAi 1.4 cm2, mean  gradient 4 mmHg.   5. The aortic valve is tricuspid. Aortic valve regurgitation is not  visualized. No aortic stenosis is present.   6. Aortic dilatation noted. There is mild dilatation of the aortic root,  measuring 41 mm. There is mild dilatation of the ascending aorta,  measuring 41 mm.   7. The inferior vena cava is normal in size with greater than 50%  respiratory variability, suggesting right atrial pressure of 3 mmHg.   Comparison(s): A prior study was performed on 01/10/21. TEE 01/10/21 EF   20-30%. MV mean PG.   FINDINGS   Left Ventricle: Left ventricular ejection fraction, by estimation, is 30  to 35%. The left ventricle has moderately decreased function. The left  ventricle demonstrates regional wall motion abnormalities. The left  ventricular internal cavity size was  normal in size. There is no left ventricular hypertrophy. Left ventricular  diastolic parameters are indeterminate.    Echo 10/30/22: IMPRESSIONS     1. Left ventricular ejection fraction, by estimation, is 30 to 35%. The  left ventricle has moderately decreased function. The left ventricle  demonstrates global hypokinesis. The left ventricular internal cavity size  was mildly dilated. Left ventricular  diastolic parameters are indeterminate.   2. Right ventricular systolic function is normal. The right ventricular  size is normal. Tricuspid regurgitation signal is inadequate for assessing  PA pressure.   3. MEDTRONIC SIMUFORM RING is at the MV anular position. . The  mitral valve has been repaired/replaced. Trivial mitral valve  regurgitation.   4. The aortic valve has an indeterminant number of cusps. There is mild  calcification of the aortic valve. There is mild thickening of the aortic  valve. Aortic valve regurgitation is not visualized. No aortic stenosis is  present.   5. Aortic dilatation noted. There is mild dilatation of the aortic root,  measuring 43 mm.   Comparison(s): Echocardiogram done 05/13/21 showed an EF of 30-35%.    Recent Labs: No results found for requested labs within last 365 days.    Lipid Panel    Component Value Date/Time   CHOL 211 (H) 12/22/2020 1546   TRIG 226 (H) 12/22/2020 1546   HDL 37 (L) 12/22/2020 1546   CHOLHDL 5.7 (H) 12/22/2020 1546   LDLCALC 133 (H) 12/22/2020 1546     Dated 07/31/19 cholesterol 190, triglycerides 99, HDL 35, LDL 137.Hgb 14.5. Dated 12/16/20: A1c 9.1%. CMET normal Dated 12/20/20: D dimer 0.61. Coags normal. Normal swab  for COVID and flu UA glucose > 1000. 15 mg/dl ketones.  CBC normal Glucose 307. Creatinine 0.97>>1.14. otherwise CMET normal.  Dated 04/21/24: A1c 8.2%. LFTs normal. Cholesterol 187, triglycerides 147, HDL 47, LDL 114. Glucose 253. Otherwise CMET normal.   Wt Readings from Last 3 Encounters:  09/03/24 194 lb  6.4 oz (88.2 kg)  06/30/24 196 lb 9.6 oz (89.2 kg)  09/28/22 188 lb 6.4 oz (85.5 kg)      ASSESSMENT AND PLAN:  1.  Chronic systolic CHF. EF 30-35%. Secondary to ischemic CM. Recommend resuming  Entresto  24/26 mg bid. Continue Jardiance . New Rx sent in. Instructed him on importance of taking medication to reduce risk of dying and hospitalization. Low sodium diet. Will arrange follow up with our Pharm D.    2. CAD s/p CABG and MV repair in June 2022. No significant angina. Has only atypical chest pain  2. DM poorly controlled. Has peripheral neuropathy. Last A1c 8.4%. discussed importance of restricting sweet intake. Unclear what he is prescribed for this other than Jardiance . Needs to follow up with PCP.  3. HLD. LDL 114 on Crestor  20 mg - not at goal of at least < 70. Recommend he see Pharm D to consider Repatha  4. HTN. BP is normal on no medication.   5. Former smoker.   6. Etoh use.  Encourage cessation of Etoh given negative effects on DM and CHF.    Current medicines are reviewed at length with the patient today.  The patient does not have concerns regarding medicines.  The following changes have been made:  See above  Labs/ tests ordered today include:   No orders of the defined types were placed in this encounter. [facility].    Disposition:   FU 6 months   Signed, Dewane Timson, MD  09/03/2024 10:46 AM    [facility] [address] [place], [place], [address] Phone [phone], Fax [phone]   "

## 2024-09-03 ENCOUNTER — Ambulatory Visit: Admitting: Cardiology

## 2024-09-03 ENCOUNTER — Encounter: Payer: Self-pay | Admitting: Cardiology

## 2024-09-03 VITALS — BP 110/69 | HR 77 | Ht 69.0 in | Wt 194.4 lb

## 2024-09-03 DIAGNOSIS — I2510 Atherosclerotic heart disease of native coronary artery without angina pectoris: Secondary | ICD-10-CM

## 2024-09-03 DIAGNOSIS — Z951 Presence of aortocoronary bypass graft: Secondary | ICD-10-CM | POA: Diagnosis not present

## 2024-09-03 DIAGNOSIS — I5022 Chronic systolic (congestive) heart failure: Secondary | ICD-10-CM

## 2024-09-03 DIAGNOSIS — Z9889 Other specified postprocedural states: Secondary | ICD-10-CM

## 2024-09-03 MED ORDER — EMPAGLIFLOZIN 10 MG PO TABS: 10.0000 mg | ORAL_TABLET | Freq: Every day | ORAL | 3 refills | Status: AC

## 2024-09-03 MED ORDER — ROSUVASTATIN CALCIUM 20 MG PO TABS: 20.0000 mg | ORAL_TABLET | Freq: Every day | ORAL | 3 refills | Status: AC

## 2024-09-03 MED ORDER — SACUBITRIL-VALSARTAN 24-26 MG PO TABS: 1.0000 | ORAL_TABLET | Freq: Two times a day (BID) | ORAL | 3 refills | Status: AC

## 2024-09-03 MED ORDER — ASPIRIN 81 MG PO TBEC: 81.0000 mg | DELAYED_RELEASE_TABLET | Freq: Every day | ORAL | 11 refills | Status: AC

## 2024-09-03 NOTE — Addendum Note (Signed)
 Addended by: CHRISTIANNE CHANNING PARAS on: 09/03/2024 10:57 AM   Modules accepted: Orders

## 2024-09-03 NOTE — Patient Instructions (Addendum)
 Medication Instructions:  Start Aspirin  81 mg daily Continue all other medications *If you need a refill on your cardiac medications before your next appointment, please call your pharmacy*  Lab Work: None ordered  Testing/Procedures: None ordered  Follow-Up: At [facility], you and your health needs are our priority.  As part of our continuing mission to provide you with exceptional heart care, our providers are all part of one team.  This team includes your primary Cardiologist (physician) and Advanced Practice Providers or APPs (Physician Assistants and Nurse Practitioners) who all work together to provide you with the care you need, when you need it.  Your next appointment:  6 months   Call in May to schedule August appointment   Provider:  Dr.Jordan   Schedule appointment with Pharm D in Lipid Clinic for Repatha   Make appointment with Primary Care Dr. for Diabetes     We recommend signing up for the patient portal called MyChart.  Sign up information is provided on this After Visit Summary.  MyChart is used to connect with patients for Virtual Visits (Telemedicine).  Patients are able to view lab/test results, encounter notes, upcoming appointments, etc.  Non-urgent messages can be sent to your provider as well.   To learn more about what you can do with MyChart, go to [email or web address].

## 2024-09-10 ENCOUNTER — Ambulatory Visit: Admitting: Pharmacist
